# Patient Record
Sex: Female | Born: 1990 | Race: Black or African American | Hispanic: No | Marital: Single | Smoking: Former smoker
Health system: Southern US, Community
[De-identification: ages and names within clinical notes are randomized; demographics above are authoritative.]

## PROBLEM LIST (undated history)

## (undated) DIAGNOSIS — N760 Acute vaginitis: Secondary | ICD-10-CM

## (undated) DIAGNOSIS — N83209 Unspecified ovarian cyst, unspecified side: Secondary | ICD-10-CM

## (undated) DIAGNOSIS — K3580 Unspecified acute appendicitis: Secondary | ICD-10-CM

## (undated) DIAGNOSIS — F419 Anxiety disorder, unspecified: Secondary | ICD-10-CM

## (undated) DIAGNOSIS — B9689 Other specified bacterial agents as the cause of diseases classified elsewhere: Secondary | ICD-10-CM

## (undated) DIAGNOSIS — Z8619 Personal history of other infectious and parasitic diseases: Secondary | ICD-10-CM

## (undated) HISTORY — DX: Other specified bacterial agents as the cause of diseases classified elsewhere: B96.89

## (undated) HISTORY — DX: Personal history of other infectious and parasitic diseases: Z86.19

## (undated) HISTORY — DX: Anxiety disorder, unspecified: F41.9

## (undated) HISTORY — DX: Other specified bacterial agents as the cause of diseases classified elsewhere: N76.0

## (undated) HISTORY — PX: APPENDECTOMY: SHX54

## (undated) HISTORY — DX: Unspecified ovarian cyst, unspecified side: N83.209

---

## 2012-06-27 ENCOUNTER — Ambulatory Visit: Payer: Self-pay | Admitting: Emergency Medicine

## 2012-06-27 VITALS — BP 123/86 | HR 72 | Temp 98.0°F | Resp 16 | Ht 61.0 in | Wt 125.0 lb

## 2012-06-27 DIAGNOSIS — J018 Other acute sinusitis: Secondary | ICD-10-CM

## 2012-06-27 DIAGNOSIS — J209 Acute bronchitis, unspecified: Secondary | ICD-10-CM

## 2012-06-27 MED ORDER — PROMETHAZINE-CODEINE 6.25-10 MG/5ML PO SYRP: 5.0000 mL | ORAL_SOLUTION | ORAL | Status: DC | PRN

## 2012-06-27 MED ORDER — PSEUDOEPHEDRINE-GUAIFENESIN ER 60-600 MG PO TB12: 1.0000 | ORAL_TABLET | Freq: Two times a day (BID) | ORAL | Status: AC

## 2012-06-27 MED ORDER — AMOXICILLIN-POT CLAVULANATE 875-125 MG PO TABS: 1.0000 | ORAL_TABLET | Freq: Two times a day (BID) | ORAL | Status: DC

## 2012-06-27 NOTE — Progress Notes (Signed)
[facility] [address] [place] [address] [phone]  Date:  06/27/2012   Name:  Alyssa Olson   DOB:  May 08, 1990   MRN:  [id number]  PCP:  No PCP Per Patient    Chief Complaint: URI, Cough and Constipation   History of Present Illness:  Alyssa Olson is a 22 y.o. very pleasant female patient who presents with the following:  Ill for a week with a "cold" that is not improving.  She has a purulent nasal drainage and post nasal drip.  Her cough is productive of scant muco-purulent sputum.  No wheezing or shortness of breath.  No current fever or chills.  Had fever earlier this week.  No nausea or vomiting. No stool change, rash.  No improvement with over the counter medications or other home remedies. Denies other complaint or health concern today.   There are no active problems to display for this patient.   History reviewed. No pertinent past medical history.  History reviewed. No pertinent past surgical history.  History  Substance Use Topics  . Smoking status: Never Smoker   . Smokeless tobacco: Not on file  . Alcohol Use: No    Family History  Problem Relation Age of Onset  . Cancer Maternal Grandfather   . Hypertension Maternal Grandfather     No Known Allergies  Medication list has been reviewed and updated.  No current outpatient prescriptions on file prior to visit.   No current facility-administered medications on file prior to visit.    Review of Systems:  As per HPI, otherwise negative.    Physical Examination: Filed Vitals:   06/27/12 1727  BP: 123/86  Pulse: 72  Temp: 98 F (36.7 C)  Resp: 16   Filed Vitals:   06/27/12 1727  Height: 5\' 1"  (1.549 m)  Weight: 125 lb (56.7 kg)   Body mass index is 23.63 kg/(m^2). Ideal Body Weight: Weight in (lb) to have BMI = 25: 132  GEN: WDWN, NAD, Non-toxic, A & O x 3 HEENT: Atraumatic, Normocephalic. Neck supple. No masses, No LAD. Ears and Nose: No external  deformity.  Purulent nasal drainage CV: RRR, No M/G/R. No JVD. No thrill. No extra heart sounds. PULM: CTA B, no wheezes, crackles, rhonchi. No retractions. No resp. distress. No accessory muscle use. ABD: S, NT, ND, +BS. No rebound. No HSM. EXTR: No c/c/e NEURO Normal gait.  PSYCH: Normally interactive. Conversant. Not depressed or anxious appearing.  Calm demeanor.    Assessment and Plan: Sinusitis augmentin mucinex d   Signed,  Phillips Odor, MD

## 2013-02-26 ENCOUNTER — Emergency Department: Payer: Self-pay | Admitting: Emergency Medicine

## 2013-02-26 LAB — CBC
HCT: 39.5 % (ref 35.0–47.0)
HGB: 13.4 g/dL (ref 12.0–16.0)
MCH: 29.6 pg (ref 26.0–34.0)
MCHC: 33.9 g/dL (ref 32.0–36.0)
MCV: 87 fL (ref 80–100)
Platelet: 293 10*3/uL (ref 150–440)
RBC: 4.53 10*6/uL (ref 3.80–5.20)
RDW: 13.5 % (ref 11.5–14.5)
WBC: 13.9 10*3/uL — ABNORMAL HIGH (ref 3.6–11.0)

## 2013-02-26 LAB — HCG, QUANTITATIVE, PREGNANCY: Beta Hcg, Quant.: 44039 m[IU]/mL — ABNORMAL HIGH

## 2013-03-28 LAB — HM PAP SMEAR: HM Pap smear: NEGATIVE

## 2013-10-22 ENCOUNTER — Inpatient Hospital Stay: Payer: Self-pay

## 2013-10-22 LAB — CBC WITH DIFFERENTIAL/PLATELET
Basophil #: 0 10*3/uL (ref 0.0–0.1)
Basophil %: 0.2 %
Eosinophil #: 0.1 10*3/uL (ref 0.0–0.7)
Eosinophil %: 0.4 %
HCT: 39.7 % (ref 35.0–47.0)
HGB: 12.6 g/dL (ref 12.0–16.0)
Lymphocyte #: 1.8 10*3/uL (ref 1.0–3.6)
Lymphocyte %: 15.5 %
MCH: 28.1 pg (ref 26.0–34.0)
MCHC: 31.8 g/dL — ABNORMAL LOW (ref 32.0–36.0)
MCV: 88 fL (ref 80–100)
Monocyte #: 1 x10 3/mm — ABNORMAL HIGH (ref 0.2–0.9)
Monocyte %: 8.2 %
Neutrophil #: 8.9 10*3/uL — ABNORMAL HIGH (ref 1.4–6.5)
Neutrophil %: 75.7 %
Platelet: 242 10*3/uL (ref 150–440)
RBC: 4.49 10*6/uL (ref 3.80–5.20)
RDW: 14.3 % (ref 11.5–14.5)
WBC: 11.8 10*3/uL — ABNORMAL HIGH (ref 3.6–11.0)

## 2013-10-23 LAB — GC/CHLAMYDIA PROBE AMP

## 2013-10-24 LAB — HEMATOCRIT: HCT: 33.8 % — ABNORMAL LOW (ref 35.0–47.0)

## 2014-05-26 NOTE — H&P (Signed)
L&D Evaluation:  History:  Presents with contractions   Patient's Medical History No Chronic Illness   Patient's Surgical History none   Medications Pre Natal Vitamins  Tylenol (Acetaminophen)  diclegis; triamcinilone   Allergies NKDA   Social History none   Family History Non-Contributory   ROS:  ROS All systems were reviewed.  HEENT, CNS, GI, GU, Respiratory, CV, Renal and Musculoskeletal systems were found to be normal.   Exam:  Vital Signs stable   General no apparent distress   Mental Status clear   Chest clear   Heart normal sinus rhythm   Abdomen gravid, tender with contractions   Estimated Fetal Weight Average for gestational age   Fetal Position vtx   Pelvic 4/80/-2   Mebranes Intact   FHT normal rate with no decels   Fetal Heart Rate 148   Ucx regular, pitocin at 529mu/min   Ucx Frequency 2 min   Length of each Contraction 60 seconds   Ucx Pain Scale 8   Skin dry   Impression:  Impression IOL at 40 weeks for oligohydramnios   Plan:  Plan EFM/NST, monitor contractions and for cervical change, antibiotics for GBBS prophylaxis, pitocin IOL per protocol   Electronic Signatures: Yolanda BonineBurr, Malaiyah Achorn N (CNM)  (Signed 07-Oct-15 21:12)  Authored: L&D Evaluation   Last Updated: 07-Oct-15 21:12 by Ulyses AmorBurr, Britnay Magnussen N (CNM)

## 2014-10-14 ENCOUNTER — Ambulatory Visit: Payer: Self-pay | Admitting: Obstetrics and Gynecology

## 2014-10-26 ENCOUNTER — Encounter: Payer: Self-pay | Admitting: *Deleted

## 2014-10-29 ENCOUNTER — Ambulatory Visit: Payer: Self-pay | Admitting: Obstetrics and Gynecology

## 2014-11-09 DIAGNOSIS — R194 Change in bowel habit: Secondary | ICD-10-CM | POA: Insufficient documentation

## 2014-11-09 DIAGNOSIS — E669 Obesity, unspecified: Secondary | ICD-10-CM | POA: Insufficient documentation

## 2014-11-09 DIAGNOSIS — M25562 Pain in left knee: Secondary | ICD-10-CM | POA: Insufficient documentation

## 2014-11-11 DIAGNOSIS — E8809 Other disorders of plasma-protein metabolism, not elsewhere classified: Secondary | ICD-10-CM | POA: Insufficient documentation

## 2014-11-11 DIAGNOSIS — R779 Abnormality of plasma protein, unspecified: Secondary | ICD-10-CM | POA: Insufficient documentation

## 2014-11-11 DIAGNOSIS — R700 Elevated erythrocyte sedimentation rate: Secondary | ICD-10-CM | POA: Insufficient documentation

## 2014-11-19 DIAGNOSIS — R768 Other specified abnormal immunological findings in serum: Secondary | ICD-10-CM | POA: Insufficient documentation

## 2014-11-25 ENCOUNTER — Ambulatory Visit: Payer: Self-pay | Admitting: Obstetrics and Gynecology

## 2015-01-01 DIAGNOSIS — G8929 Other chronic pain: Secondary | ICD-10-CM | POA: Insufficient documentation

## 2015-01-01 DIAGNOSIS — M545 Low back pain, unspecified: Secondary | ICD-10-CM | POA: Insufficient documentation

## 2015-03-19 ENCOUNTER — Encounter: Payer: Self-pay | Admitting: Obstetrics and Gynecology

## 2015-04-09 ENCOUNTER — Other Ambulatory Visit: Payer: Self-pay | Admitting: Obstetrics and Gynecology

## 2015-04-09 ENCOUNTER — Ambulatory Visit (INDEPENDENT_AMBULATORY_CARE_PROVIDER_SITE_OTHER): Payer: Managed Care, Other (non HMO) | Admitting: Obstetrics and Gynecology

## 2015-04-09 ENCOUNTER — Encounter: Payer: Self-pay | Admitting: Obstetrics and Gynecology

## 2015-04-09 VITALS — BP 118/86 | HR 86 | Ht 61.0 in | Wt 149.8 lb

## 2015-04-09 DIAGNOSIS — Z01419 Encounter for gynecological examination (general) (routine) without abnormal findings: Secondary | ICD-10-CM | POA: Diagnosis not present

## 2015-04-09 NOTE — Progress Notes (Signed)
  Subjective:     Alyssa Olson is a 25 y.o. female and is here for a comprehensive physical exam. The patient reports no problems. Stopped OCPs and is using condoms. Social History   Social History  . Marital Status: Single    Spouse Name: N/A  . Number of Children: N/A  . Years of Education: N/A   Occupational History  . Not on file.   Social History Main Topics  . Smoking status: Current Every Day Smoker  . Smokeless tobacco: Never Used  . Alcohol Use: Yes  . Drug Use: No  . Sexual Activity: Yes    Birth Control/ Protection: None   Other Topics Concern  . Not on file   Social History Narrative   Health Maintenance  Topic Date Due  . HIV Screening  12/03/2006  . TETANUS/TDAP  12/03/2010  . INFLUENZA VACCINE  08/17/2014  . PAP SMEAR  03/28/2016    The following portions of the patient's history were reviewed and updated as appropriate: allergies, current medications, past family history, past medical history, past social history, past surgical history and problem list.  Review of Systems A comprehensive review of systems was negative.   Objective:    General appearance: alert, cooperative and appears stated age Neck: no adenopathy, no carotid bruit, no JVD, supple, symmetrical, trachea midline and thyroid not enlarged, symmetric, no tenderness/mass/nodules Breasts: normal appearance, no masses or tenderness Heart: regular rate and rhythm, S1, S2 normal, no murmur, click, rub or gallop Abdomen: soft, non-tender; bowel sounds normal; no masses,  no organomegaly Pelvic: cervix normal in appearance, external genitalia normal, no adnexal masses or tenderness, no cervical motion tenderness, rectovaginal septum normal, uterus normal size, shape, and consistency and vagina normal without discharge    Assessment:    Healthy female exam. overweight      Plan:     See After Visit Summary for Counseling Recommendations

## 2015-04-09 NOTE — Patient Instructions (Signed)
  Place annual gynecologic exam patient instructions here.  Thank you for enrolling in MyChart. Please follow the instructions below to securely access your online medical record. MyChart allows you to send messages to your doctor, view your test results, manage appointments, and more.   How Do I Sign Up? 1. In your Internet browser, go to [employer] Address [employer] and enter [email or web address]. 2. Click on the Sign Up Now link in the Sign In box. You will see the New Member Sign Up page. 3. Enter your MyChart Access Code exactly as it appears below. You will not need to use this code after you've completed the sign-up process. If you do not sign up before the expiration date, you must request a new code.  MyChart Access Code: [email or web address] Expires: 05/18/2015 12:28 PM  4. Enter your Social Security Number ([id number]) and Date of Birth (mm/dd/yyyy) as indicated and click Submit. You will be taken to the next sign-up page. 5. Create a MyChart ID. This will be your MyChart login ID and cannot be changed, so think of one that is secure and easy to remember. 6. Create a MyChart password. You can change your password at any time. 7. Enter your Password Reset Question and Answer. This can be used at a later time if you forget your password.  8. Enter your e-mail address. You will receive e-mail notification when new information is available in MyChart. 9. Click Sign Up. You can now view your medical record.   Additional Information Remember, MyChart is NOT to be used for urgent needs. For medical emergencies, dial 911.

## 2015-04-12 LAB — CYTOLOGY - PAP

## 2015-04-13 ENCOUNTER — Telehealth: Payer: Self-pay | Admitting: *Deleted

## 2015-04-13 ENCOUNTER — Other Ambulatory Visit: Payer: Self-pay | Admitting: Obstetrics and Gynecology

## 2015-04-13 DIAGNOSIS — A749 Chlamydial infection, unspecified: Secondary | ICD-10-CM | POA: Insufficient documentation

## 2015-04-13 MED ORDER — AZITHROMYCIN 500 MG PO TABS: 1000.0000 mg | ORAL_TABLET | Freq: Once | ORAL | Status: DC

## 2015-04-13 NOTE — Telephone Encounter (Signed)
-----   Message from Purcell NailsMelody N Shambley, [place] sent at 04/13/2015  3:18 PM EDT ----- Please let pt know about +chlamydia on pap, I sent in prescription and I want to see her in 1 month for TOC

## 2015-04-13 NOTE — Telephone Encounter (Signed)
Notified pt of results she will call back for TOC

## 2015-05-19 ENCOUNTER — Encounter: Payer: Self-pay | Admitting: Obstetrics and Gynecology

## 2015-05-19 ENCOUNTER — Ambulatory Visit (INDEPENDENT_AMBULATORY_CARE_PROVIDER_SITE_OTHER): Payer: Managed Care, Other (non HMO) | Admitting: Obstetrics and Gynecology

## 2015-05-19 ENCOUNTER — Other Ambulatory Visit: Payer: Self-pay | Admitting: Obstetrics and Gynecology

## 2015-05-19 VITALS — BP 132/84 | HR 88 | Ht 61.0 in | Wt 145.5 lb

## 2015-05-19 DIAGNOSIS — Z202 Contact with and (suspected) exposure to infections with a predominantly sexual mode of transmission: Secondary | ICD-10-CM | POA: Diagnosis not present

## 2015-05-19 NOTE — Patient Instructions (Signed)
Thank you for enrolling in MyChart. Please follow the instructions below to securely access your online medical record. MyChart allows you to send messages to your doctor, view your test results, renew your prescriptions, schedule appointments, and more.  How Do I Sign Up? 1. In your Internet browser, go to [email or web address]. 2. Click on the New  User? link in the Sign In box.  3. Enter your MyChart Access Code exactly as it appears below. You will not need to use this code after you have completed the sign-up process. If you do not sign up before the expiration date, you must request a new code. MyChart Access Code: [email or web address] Expires: 07/18/2015  3:20 PM  4. Enter the last four digits of your Social Security Number (xxxx) and Date of Birth (mm/dd/yyyy) as indicated and click Next. You will be taken to the next sign-up page. 5. Create a MyChart ID. This will be your MyChart login ID and cannot be changed, so think of one that is secure and easy to remember. 6. Create a MyChart password. You can change your password at any time. 7. Enter your Password Reset Question and Answer and click Next. This can be used at a later time if you forget your password.  8. Select your communication preference, and if applicable enter your e-mail address. You will receive e-mail notification when new information is available in MyChart by choosing to receive e-mail notifications and filling in your e-mail. 9. Click Sign In. You can now view your medical record.   Additional Information If you have questions, you can email [email or web address] or call [phone] to talk to our MyChart staff. Remember, MyChart is NOT to be used for urgent needs. For medical emergencies, dial 911.

## 2015-05-19 NOTE — Progress Notes (Signed)
Subjective:     Patient ID: Alyssa Olson, female   DOB: [birth year], 25 y.o.   MRN: [id number]  HPI +CMZ one month ago- [facility] denies any symptoms and no sexual contact  Review of Systems negative    Objective:   Physical Exam A&O x4 Well groomed female in no distress Blood pressure 132/84, pulse 88, height 5\' 1"  (1.549 m), weight 145 lb 8 oz (65.998 kg), last menstrual period 05/09/2015, not currently breastfeeding. Pelvic exam: normal external genitalia, vulva, vagina, cervix, uterus and adnexa.    Assessment:     CMZ TOC     Plan:     Will let patient no results. Reminded of need for abstinance &/or condoms use for STI prevention.  Evelynn Hench [place], CNM

## 2015-05-21 DIAGNOSIS — E781 Pure hyperglyceridemia: Secondary | ICD-10-CM | POA: Insufficient documentation

## 2015-05-25 ENCOUNTER — Telehealth: Payer: Self-pay | Admitting: *Deleted

## 2015-05-25 ENCOUNTER — Other Ambulatory Visit: Payer: Self-pay | Admitting: Obstetrics and Gynecology

## 2015-05-25 MED ORDER — CLINDAMYCIN PHOSPHATE (1 DOSE) 2 % VA CREA: 1.0000 | TOPICAL_CREAM | Freq: Every day | VAGINAL | Status: DC

## 2015-05-25 NOTE — Telephone Encounter (Signed)
-----   Message from ExiraMelody N Shambley, [place] sent at 05/25/2015  4:24 PM EDT ----- Please let her know her vaginal swab showed BV resistent to traditional meds- so I sent in a prescription for vaginal cream- to use one applicatorful at bedtime for a week, no STD seen

## 2015-05-25 NOTE — Telephone Encounter (Signed)
Notified pt of results and medication sent to pharmacy

## 2015-05-27 ENCOUNTER — Encounter: Payer: Self-pay | Admitting: Obstetrics and Gynecology

## 2015-06-10 ENCOUNTER — Encounter: Payer: Self-pay | Admitting: Obstetrics and Gynecology

## 2015-10-14 ENCOUNTER — Telehealth: Payer: Managed Care, Other (non HMO) | Admitting: Physician Assistant

## 2015-10-14 DIAGNOSIS — B373 Candidiasis of vulva and vagina: Secondary | ICD-10-CM

## 2015-10-14 DIAGNOSIS — B3731 Acute candidiasis of vulva and vagina: Secondary | ICD-10-CM

## 2015-10-14 MED ORDER — FLUCONAZOLE 150 MG PO TABS: 150.0000 mg | ORAL_TABLET | Freq: Once | ORAL | 0 refills | Status: AC

## 2015-10-14 NOTE — Progress Notes (Signed)

## 2015-11-12 ENCOUNTER — Ambulatory Visit
Admission: EM | Admit: 2015-11-12 | Discharge: 2015-11-12 | Disposition: A | Discharge status: Home / Self Care | Payer: Managed Care, Other (non HMO) | Attending: Family Medicine | Admitting: Family Medicine

## 2015-11-12 DIAGNOSIS — N3000 Acute cystitis without hematuria: Secondary | ICD-10-CM | POA: Diagnosis not present

## 2015-11-12 LAB — URINALYSIS COMPLETE WITH MICROSCOPIC (ARMC ONLY)
Bacteria, UA: NONE SEEN
Bilirubin Urine: NEGATIVE
Glucose, UA: NEGATIVE mg/dL
Hgb urine dipstick: NEGATIVE
Ketones, ur: NEGATIVE mg/dL
Nitrite: NEGATIVE
Protein, ur: NEGATIVE mg/dL
RBC / HPF: NONE SEEN RBC/hpf (ref 0–5)
Specific Gravity, Urine: 1.02 (ref 1.005–1.030)
pH: 7 (ref 5.0–8.0)

## 2015-11-12 MED ORDER — CIPROFLOXACIN HCL 500 MG PO TABS: 500.0000 mg | ORAL_TABLET | Freq: Two times a day (BID) | ORAL | 0 refills | Status: DC

## 2015-11-12 MED ORDER — PHENAZOPYRIDINE HCL 200 MG PO TABS: 200.0000 mg | ORAL_TABLET | Freq: Three times a day (TID) | ORAL | 0 refills | Status: DC | PRN

## 2015-11-12 NOTE — ED Provider Notes (Signed)
[facility]    CSN: [id number] Arrival date & time: 11/12/15  1030     History   Chief Complaint Chief Complaint  Patient presents with  . Urinary Tract Infection    HPI Alyssa Olson is a 25 y.o. female.   Patient is a 25 year old black female who is here because of burning on urination frequency. She denies any type of vaginal discharge started about 5-7 days ago. She states she also is no longer with her baby's father and also was rechecked for STDs as well. No known drug allergies. She does not smoke. No chronic medical problems and no pertinent family medical history pertaining to today's visit.   The history is provided by the patient. No language interpreter was used.  Urinary Tract Infection  Pain quality:  Burning and aching Pain severity:  Moderate Progression:  Worsening Chronicity:  New Relieved by:  Nothing Associated symptoms: no vaginal discharge   Risk factors: sexually active   Risk factors: no recurrent urinary tract infections   Risk factors comment:  Hx of bacterial vaginitis   History reviewed. No pertinent past medical history.  There are no active problems to display for this patient.   History reviewed. No pertinent surgical history.  OB History    No data available       Home Medications    Prior to Admission medications   Medication Sig Start Date End Date Taking? Authorizing Provider  ciprofloxacin (CIPRO) 500 MG tablet Take 1 tablet (500 mg total) by mouth 2 (two) times daily. 11/12/15   Hassan RowanEugene Shaleah Nissley, MD  phenazopyridine (PYRIDIUM) 200 MG tablet Take 1 tablet (200 mg total) by mouth 3 (three) times daily as needed for pain. 11/12/15   Hassan RowanEugene Laquasha Groome, MD    Family History Family History  Problem Relation Age of Onset  . Cancer Maternal Grandfather   . Hypertension Maternal Grandfather     Social History Social History  Substance Use Topics  . Smoking status: Never Smoker  . Smokeless tobacco: Never Used  .  Alcohol use No     Allergies   Review of patient's allergies indicates no known allergies.   Review of Systems Review of Systems  Genitourinary: Positive for dysuria, frequency and urgency. Negative for vaginal discharge.  All other systems reviewed and are negative.    Physical Exam Triage Vital Signs ED Triage Vitals  Enc Vitals Group     BP 11/12/15 1051 119/86     Pulse Rate 11/12/15 1051 83     Resp 11/12/15 1051 18     Temp 11/12/15 1051 97.8 F (36.6 C)     Temp Source 11/12/15 1051 Oral     SpO2 11/12/15 1051 99 %     Weight 11/12/15 1050 147 lb (66.7 kg)     Height 11/12/15 1050 5\' 1"  (1.549 m)     Head Circumference --      Peak Flow --      Pain Score 11/12/15 1051 0     Pain Loc --      Pain Edu? --      Excl. in GC? --    No data found.   Updated Vital Signs BP 119/86 (BP Location: Left Arm)   Pulse 83   Temp 97.8 F (36.6 C) (Oral)   Resp 18   Ht 5\' 1"  (1.549 m)   Wt 147 lb (66.7 kg)   LMP 11/03/2015   SpO2 99%   BMI 27.78 kg/m   Visual  Acuity Right Eye Distance:   Left Eye Distance:   Bilateral Distance:    Right Eye Near:   Left Eye Near:    Bilateral Near:     Physical Exam  Constitutional: She is oriented to person, place, and time. She appears well-developed and well-nourished.  HENT:  Head: Normocephalic and atraumatic.  Eyes: Pupils are equal, round, and reactive to light.  Neck: Normal range of motion.  Pulmonary/Chest: Breath sounds normal.  Abdominal: Soft. Bowel sounds are normal. She exhibits no distension. There is no tenderness.  Musculoskeletal: Normal range of motion. She exhibits no deformity.  Neurological: She is alert and oriented to person, place, and time.  Skin: Skin is warm.  Psychiatric: She has a normal mood and affect.  Vitals reviewed.    UC Treatments / Results  Labs (all labs ordered are listed, but only abnormal results are displayed) Labs Reviewed  URINALYSIS COMPLETEWITH MICROSCOPIC ([facility]  ONLY) - Abnormal; Notable for the following:       Result Value   Leukocytes, UA TRACE (*)    Squamous Epithelial / LPF 0-5 (*)    All other components within normal limits  URINE CULTURE    EKG  EKG Interpretation None       Radiology No results found.  Procedures Procedures (including critical care time)  Medications Ordered in UC Medications - No data to display   Initial Impression / Assessment and Plan / UC Course  I have reviewed the triage vital signs and the nursing notes.  Pertinent labs & imaging results that were available during my care of the patient were reviewed by me and considered in my medical decision making (see chart for details).  Clinical Course    Patient was informed that her urine was abnormal and that it does appear that she may have a UTI. We will culture urine. Patient also worried about STDs apparently she is longer the relationship that she was in before with the baby's father. The problem is that she came with the baby who is very active and explained to the mother and be very difficult to do a pelvic exam with this young man in the room and I doubt that he would be sitting quite enough even on her side or in the reception area without her. Recommend that she retry to return and I am here Saturday and Sunday and will be glad to see her if she can have someone either come with her to wash her son or have someone babysit her son at home   We'll start her on Cipro 500 mg 1 tablet twice a day for 5 days and Pyridium 200 mg 1 tablet 3 times a day for 5 days when necessary. Work note given for today as well.  Final Clinical Impressions(s) / UC Diagnoses   Final diagnoses:  Acute cystitis without hematuria    New Prescriptions New Prescriptions   CIPROFLOXACIN (CIPRO) 500 MG TABLET    Take 1 tablet (500 mg total) by mouth 2 (two) times daily.   PHENAZOPYRIDINE (PYRIDIUM) 200 MG TABLET    Take 1 tablet (200 mg total) by mouth 3 (three) times  daily as needed for pain.     Note: This dictation was prepared with Dragon dictation along with smaller phrase technology. Any transcriptional errors that result from this process are unintentional.   Hassan Rowan, MD 11/12/15 1209

## 2015-11-12 NOTE — ED Triage Notes (Signed)
Pt says she is having frequency with voiding, and odor. She is unsure of an exposure to an STD but she just wants to be checked because she does have unprotected sex. Denies any vaginal discharge.

## 2015-11-14 LAB — URINE CULTURE: Culture: NO GROWTH

## 2015-11-15 ENCOUNTER — Telehealth: Payer: Self-pay

## 2015-11-15 NOTE — Telephone Encounter (Signed)
Courtesy call back completed today for patients recent visit at [facility]. Patient did not answer, left message on voicemail to call back with any questions or concerns.   

## 2015-11-15 NOTE — Telephone Encounter (Signed)
-----   Message from Hassan RowanEugene Wade, MD sent at 11/14/2015  9:02 AM EDT ----- Please notify patient that her urine culture was negative. She may finish the antibiotic for the possible UTI and she still wants to be evaluated for STD exposure please return

## 2016-02-11 ENCOUNTER — Ambulatory Visit
Admission: EM | Admit: 2016-02-11 | Discharge: 2016-02-11 | Disposition: A | Discharge status: Home / Self Care | Payer: 59 | Attending: Family Medicine | Admitting: Family Medicine

## 2016-02-11 ENCOUNTER — Encounter: Payer: Self-pay | Admitting: Emergency Medicine

## 2016-02-11 DIAGNOSIS — J0100 Acute maxillary sinusitis, unspecified: Secondary | ICD-10-CM

## 2016-02-11 LAB — RAPID STREP SCREEN (MED CTR MEBANE ONLY): Streptococcus, Group A Screen (Direct): NEGATIVE

## 2016-02-11 MED ORDER — AMOXICILLIN-POT CLAVULANATE 875-125 MG PO TABS: 1.0000 | ORAL_TABLET | Freq: Two times a day (BID) | ORAL | 0 refills | Status: DC

## 2016-02-11 NOTE — ED Triage Notes (Signed)
Patient c/o sore throat, cough, low grade fever, and congestion for a week.

## 2016-02-11 NOTE — ED Provider Notes (Signed)
[facility]    CSN: [id number] Arrival date & time: 02/11/16  1945     History   Chief Complaint Chief Complaint  Patient presents with  . Sore Throat  . Cough    HPI Alyssa Olson is a 26 y.o. female.   The history is provided by the patient.  Sore Throat  Associated symptoms include headaches.  Cough  Associated symptoms: fever, headaches and sore throat   Associated symptoms: no wheezing   URI  Presenting symptoms: congestion, cough, fatigue, fever and sore throat   Severity:  Moderate Onset quality:  Sudden Duration:  10 days Timing:  Constant Progression:  Worsening Chronicity:  New Relieved by:  Nothing Ineffective treatments:  OTC medications Associated symptoms: headaches and sinus pain   Associated symptoms: no wheezing   Risk factors: sick contacts   Risk factors: not elderly, no chronic cardiac disease, no chronic kidney disease, no chronic respiratory disease, no diabetes mellitus, no immunosuppression, no recent illness and no recent travel     Past Medical History:  Diagnosis Date  . Bacterial vaginosis   . Bacterial vaginosis   . History of chlamydia     Patient Active Problem List   Diagnosis Date Noted  . Chlamydia 04/13/2015    History reviewed. No pertinent surgical history.  OB History    Gravida Para Term Preterm AB Living   2 0 0 0 1 1   SAB TAB Ectopic Multiple Live Births   0 1 0   1       Home Medications    Prior to Admission medications   Medication Sig Start Date End Date Taking? Authorizing Provider  predniSONE (DELTASONE) 5 MG tablet Take 5 mg by mouth 2 (two) times daily with a meal.   Yes Historical Provider, MD  amoxicillin-clavulanate (AUGMENTIN) 875-125 MG tablet Take 1 tablet by mouth 2 (two) times daily. 02/11/16   Payton Mccallumrlando Corrinna Karapetyan, MD    Family History Family History  Problem Relation Age of Onset  . Cancer Maternal Grandfather     colon  . Hypertension Maternal Grandfather     Social  History Social History  Substance Use Topics  . Smoking status: Never Smoker  . Smokeless tobacco: Never Used  . Alcohol use No     Allergies   Patient has no known allergies.   Review of Systems Review of Systems  Constitutional: Positive for fatigue and fever.  HENT: Positive for congestion, sinus pain and sore throat.   Respiratory: Positive for cough. Negative for wheezing.   Neurological: Positive for headaches.     Physical Exam Triage Vital Signs ED Triage Vitals  Enc Vitals Group     BP 02/11/16 1956 119/89     Pulse Rate 02/11/16 1956 (!) 103     Resp 02/11/16 1956 16     Temp 02/11/16 1956 98.5 F (36.9 C)     Temp Source 02/11/16 1956 Oral     SpO2 02/11/16 1956 99 %     Weight 02/11/16 1954 148 lb (67.1 kg)     Height 02/11/16 1954 5\' 1"  (1.549 m)     Head Circumference --      Peak Flow --      Pain Score 02/11/16 1956 6     Pain Loc --      Pain Edu? --      Excl. in GC? --    No data found.   Updated Vital Signs BP 119/89 (BP Location: Left  Arm)   Pulse (!) 103   Temp 98.5 F (36.9 C) (Oral)   Resp 16   Ht 5\' 1"  (1.549 m)   Wt 148 lb (67.1 kg)   LMP 01/20/2016 (Approximate)   SpO2 99%   BMI 27.96 kg/m   Visual Acuity Right Eye Distance:   Left Eye Distance:   Bilateral Distance:    Right Eye Near:   Left Eye Near:    Bilateral Near:     Physical Exam  Constitutional: She appears well-developed and well-nourished. No distress.  HENT:  Head: Normocephalic and atraumatic.  Right Ear: Tympanic membrane, external ear and ear canal normal.  Left Ear: Tympanic membrane, external ear and ear canal normal.  Nose: Mucosal edema and rhinorrhea present. No nose lacerations, sinus tenderness, nasal deformity, septal deviation or nasal septal hematoma. No epistaxis.  No foreign bodies. Right sinus exhibits maxillary sinus tenderness and frontal sinus tenderness. Left sinus exhibits maxillary sinus tenderness and frontal sinus tenderness.    Mouth/Throat: Uvula is midline, oropharynx is clear and moist and mucous membranes are normal. No oropharyngeal exudate.  Eyes: Conjunctivae and EOM are normal. Pupils are equal, round, and reactive to light. Right eye exhibits no discharge. Left eye exhibits no discharge. No scleral icterus.  Neck: Normal range of motion. Neck supple. No thyromegaly present.  Cardiovascular: Normal rate, regular rhythm and normal heart sounds.   Pulmonary/Chest: Effort normal and breath sounds normal. No respiratory distress. She has no wheezes. She has no rales.  Lymphadenopathy:    She has no cervical adenopathy.  Skin: She is not diaphoretic.  Nursing note and vitals reviewed.    UC Treatments / Results  Labs (all labs ordered are listed, but only abnormal results are displayed) Labs Reviewed  RAPID STREP SCREEN (NOT AT [facility])  CULTURE, GROUP A STREP [facility])    EKG  EKG Interpretation None       Radiology No results found.  Procedures Procedures (including critical care time)  Medications Ordered in UC Medications - No data to display   Initial Impression / Assessment and Plan / UC Course  I have reviewed the triage vital signs and the nursing notes.  Pertinent labs & imaging results that were available during my care of the patient were reviewed by me and considered in my medical decision making (see chart for details).       Final Clinical Impressions(s) / UC Diagnoses   Final diagnoses:  Acute maxillary sinusitis, recurrence not specified    New Prescriptions New Prescriptions   AMOXICILLIN-CLAVULANATE (AUGMENTIN) 875-125 MG TABLET    Take 1 tablet by mouth 2 (two) times daily.   1. diagnosis reviewed with patient 2. rx as per orders above; reviewed possible side effects, interactions, risks and benefits  3. Recommend supportive treatment with otc analgesics prn, fluids 4. Follow-up prn if symptoms worsen or don't improve   Payton Mccallum, MD 02/11/16 2031

## 2016-02-13 LAB — CULTURE, GROUP A STREP (THRC)

## 2016-02-15 ENCOUNTER — Telehealth: Payer: Self-pay

## 2016-02-15 NOTE — Telephone Encounter (Signed)
-----   Message from Payton Mccallumrlando Conty, MD sent at 02/15/2016  9:42 AM EST ----- Notify patient of culture result; patient on augmentin; continue and finish antibiotic

## 2016-02-15 NOTE — Telephone Encounter (Signed)
Left message for patient to return my call about test results.

## 2016-04-24 ENCOUNTER — Encounter: Payer: Self-pay | Admitting: Certified Nurse Midwife

## 2016-05-19 ENCOUNTER — Encounter: Payer: Self-pay | Admitting: Obstetrics and Gynecology

## 2016-08-05 ENCOUNTER — Encounter: Payer: Self-pay | Admitting: Medical Oncology

## 2016-08-05 ENCOUNTER — Emergency Department: Payer: 59 | Admitting: Anesthesiology

## 2016-08-05 ENCOUNTER — Encounter
Admission: EM | Disposition: A | Discharge status: Home / Self Care | Payer: Self-pay | Source: Home / Self Care | Attending: Emergency Medicine

## 2016-08-05 ENCOUNTER — Observation Stay
Admission: EM | Admit: 2016-08-05 | Discharge: 2016-08-06 | Disposition: A | Discharge status: Home / Self Care | Payer: 59 | Attending: Surgery | Admitting: Surgery

## 2016-08-05 ENCOUNTER — Emergency Department: Payer: 59

## 2016-08-05 DIAGNOSIS — R1031 Right lower quadrant pain: Secondary | ICD-10-CM | POA: Diagnosis present

## 2016-08-05 DIAGNOSIS — K353 Acute appendicitis with localized peritonitis, without perforation or gangrene: Secondary | ICD-10-CM

## 2016-08-05 DIAGNOSIS — K3580 Unspecified acute appendicitis: Secondary | ICD-10-CM | POA: Diagnosis present

## 2016-08-05 DIAGNOSIS — E669 Obesity, unspecified: Secondary | ICD-10-CM | POA: Insufficient documentation

## 2016-08-05 DIAGNOSIS — Z6830 Body mass index (BMI) 30.0-30.9, adult: Secondary | ICD-10-CM | POA: Diagnosis not present

## 2016-08-05 HISTORY — PX: LAPAROSCOPIC APPENDECTOMY: SHX408

## 2016-08-05 HISTORY — DX: Unspecified acute appendicitis: K35.80

## 2016-08-05 LAB — WET PREP, GENITAL
Clue Cells Wet Prep HPF POC: NONE SEEN
Sperm: NONE SEEN
Trich, Wet Prep: NONE SEEN
Yeast Wet Prep HPF POC: NONE SEEN

## 2016-08-05 LAB — URINALYSIS, COMPLETE (UACMP) WITH MICROSCOPIC
Bacteria, UA: NONE SEEN
Bilirubin Urine: NEGATIVE
Glucose, UA: NEGATIVE mg/dL
Ketones, ur: NEGATIVE mg/dL
Leukocytes, UA: NEGATIVE
Nitrite: NEGATIVE
Protein, ur: NEGATIVE mg/dL
Specific Gravity, Urine: 1.027 (ref 1.005–1.030)
pH: 7 (ref 5.0–8.0)

## 2016-08-05 LAB — LIPASE, BLOOD: Lipase: 31 U/L (ref 11–51)

## 2016-08-05 LAB — COMPREHENSIVE METABOLIC PANEL
ALT: 15 U/L (ref 14–54)
AST: 20 U/L (ref 15–41)
Albumin: 4.2 g/dL (ref 3.5–5.0)
Alkaline Phosphatase: 53 U/L (ref 38–126)
Anion gap: 6 (ref 5–15)
BUN: 11 mg/dL (ref 6–20)
CO2: 23 mmol/L (ref 22–32)
Calcium: 8.9 mg/dL (ref 8.9–10.3)
Chloride: 107 mmol/L (ref 101–111)
Creatinine, Ser: 0.71 mg/dL (ref 0.44–1.00)
GFR calc Af Amer: >60 mL/min (ref >60)
GFR calc non Af Amer: >60 mL/min (ref >60)
Glucose, Bld: 96 mg/dL (ref 65–99)
Potassium: 4 mmol/L (ref 3.5–5.1)
Sodium: 136 mmol/L (ref 135–145)
Total Bilirubin: 0.9 mg/dL (ref 0.3–1.2)
Total Protein: 7.5 g/dL (ref 6.5–8.1)

## 2016-08-05 LAB — CBC
HCT: 39.3 % (ref 35.0–47.0)
Hemoglobin: 13.6 g/dL (ref 12.0–16.0)
MCH: 29.3 pg (ref 26.0–34.0)
MCHC: 34.6 g/dL (ref 32.0–36.0)
MCV: 84.8 fL (ref 80.0–100.0)
Platelets: 292 10*3/uL (ref 150–440)
RBC: 4.63 MIL/uL (ref 3.80–5.20)
RDW: 13.3 % (ref 11.5–14.5)
WBC: 14.9 10*3/uL — ABNORMAL HIGH (ref 3.6–11.0)

## 2016-08-05 LAB — CHLAMYDIA/NGC RT PCR (ARMC ONLY)
Chlamydia Tr: NOT DETECTED
N gonorrhoeae: NOT DETECTED

## 2016-08-05 LAB — POCT PREGNANCY, URINE: Preg Test, Ur: NEGATIVE

## 2016-08-05 SURGERY — APPENDECTOMY, LAPAROSCOPIC
Anesthesia: General | Site: Abdomen | Wound class: Clean Contaminated

## 2016-08-05 MED ORDER — MORPHINE SULFATE (PF) 4 MG/ML IV SOLN
INTRAVENOUS | Status: AC
  Filled 2016-08-05: qty 1

## 2016-08-05 MED ORDER — ONDANSETRON HCL 4 MG/2ML IJ SOLN
4.0000 mg | Freq: Once | INTRAMUSCULAR | Status: AC
  Administered 2016-08-05: 4 mg via INTRAVENOUS
  Filled 2016-08-05: qty 2

## 2016-08-05 MED ORDER — MIDAZOLAM HCL 2 MG/2ML IJ SOLN
INTRAMUSCULAR | Status: DC | PRN
Start: 2016-08-05 — End: 2016-08-05
  Administered 2016-08-05: 2 mg via INTRAVENOUS

## 2016-08-05 MED ORDER — IOPAMIDOL (ISOVUE-300) INJECTION 61%
100.0000 mL | Freq: Once | INTRAVENOUS | Status: AC | PRN
  Administered 2016-08-05: 100 mL via INTRAVENOUS

## 2016-08-05 MED ORDER — METRONIDAZOLE IN NACL 5-0.79 MG/ML-% IV SOLN
500.0000 mg | Freq: Three times a day (TID) | INTRAVENOUS | Status: DC
  Administered 2016-08-05: 500 mg via INTRAVENOUS
  Filled 2016-08-05: qty 100

## 2016-08-05 MED ORDER — LACTATED RINGERS IV SOLN
INTRAVENOUS | Status: DC | PRN
  Administered 2016-08-05: 21:00:00 via INTRAVENOUS

## 2016-08-05 MED ORDER — ACETAMINOPHEN 10 MG/ML IV SOLN
INTRAVENOUS | Status: AC
  Filled 2016-08-05: qty 100

## 2016-08-05 MED ORDER — LACTATED RINGERS IV SOLN
125.0000 mL/h | INTRAVENOUS | Status: DC
  Administered 2016-08-06: 125 mL/h via INTRAVENOUS

## 2016-08-05 MED ORDER — MIDAZOLAM HCL 2 MG/2ML IJ SOLN
INTRAMUSCULAR | Status: AC
  Filled 2016-08-05: qty 2

## 2016-08-05 MED ORDER — BUPIVACAINE HCL 0.5 % IJ SOLN
INTRAMUSCULAR | Status: DC | PRN
  Administered 2016-08-05: 30 mL

## 2016-08-05 MED ORDER — ONDANSETRON HCL 4 MG/2ML IJ SOLN: 4.0000 mg | Freq: Four times a day (QID) | INTRAMUSCULAR | Status: DC | PRN

## 2016-08-05 MED ORDER — KETOROLAC TROMETHAMINE 30 MG/ML IJ SOLN
INTRAMUSCULAR | Status: DC | PRN
  Administered 2016-08-05: 30 mg via INTRAVENOUS

## 2016-08-05 MED ORDER — ACETAMINOPHEN 500 MG PO TABS: 1000.0000 mg | ORAL_TABLET | Freq: Four times a day (QID) | ORAL | Status: DC | PRN

## 2016-08-05 MED ORDER — PROPOFOL 10 MG/ML IV BOLUS
INTRAVENOUS | Status: DC | PRN
  Administered 2016-08-05: 2170 mg via INTRAVENOUS

## 2016-08-05 MED ORDER — PANTOPRAZOLE SODIUM 40 MG IV SOLR
40.0000 mg | Freq: Every day | INTRAVENOUS | Status: DC
  Filled 2016-08-05: qty 40

## 2016-08-05 MED ORDER — OXYCODONE HCL 5 MG PO TABS: 5.0000 mg | ORAL_TABLET | ORAL | Status: DC | PRN

## 2016-08-05 MED ORDER — FENTANYL CITRATE (PF) 100 MCG/2ML IJ SOLN: 25.0000 ug | INTRAMUSCULAR | Status: DC | PRN

## 2016-08-05 MED ORDER — HYDROMORPHONE HCL 1 MG/ML IJ SOLN
0.5000 mg | INTRAMUSCULAR | Status: DC | PRN
  Administered 2016-08-06: 0.5 mg via INTRAVENOUS
  Filled 2016-08-05: qty 0.5

## 2016-08-05 MED ORDER — SUCCINYLCHOLINE CHLORIDE 20 MG/ML IJ SOLN
INTRAMUSCULAR | Status: DC | PRN
Start: 2016-08-05 — End: 2016-08-05
  Administered 2016-08-05: 100 mg via INTRAVENOUS

## 2016-08-05 MED ORDER — SODIUM CHLORIDE 0.9 % IV BOLUS (SEPSIS)
1000.0000 mL | Freq: Once | INTRAVENOUS | Status: AC
  Administered 2016-08-05: 1000 mL via INTRAVENOUS

## 2016-08-05 MED ORDER — PROMETHAZINE HCL 25 MG/ML IJ SOLN: 6.2500 mg | INTRAMUSCULAR | Status: DC | PRN

## 2016-08-05 MED ORDER — MORPHINE SULFATE (PF) 4 MG/ML IV SOLN
4.0000 mg | Freq: Once | INTRAVENOUS | Status: AC
  Administered 2016-08-05: 4 mg via INTRAVENOUS

## 2016-08-05 MED ORDER — ROCURONIUM BROMIDE 100 MG/10ML IV SOLN
INTRAVENOUS | Status: DC | PRN
  Administered 2016-08-05: 35 mg via INTRAVENOUS
  Administered 2016-08-05: 5 mg via INTRAVENOUS

## 2016-08-05 MED ORDER — LIDOCAINE HCL (PF) 2 % IJ SOLN
INTRAMUSCULAR | Status: AC
  Filled 2016-08-05: qty 2

## 2016-08-05 MED ORDER — LIDOCAINE HCL (CARDIAC) 20 MG/ML IV SOLN
INTRAVENOUS | Status: DC | PRN
  Administered 2016-08-05: 100 mg via INTRAVENOUS

## 2016-08-05 MED ORDER — MORPHINE SULFATE (PF) 4 MG/ML IV SOLN
4.0000 mg | Freq: Once | INTRAVENOUS | Status: AC
  Administered 2016-08-05: 4 mg via INTRAVENOUS
  Filled 2016-08-05: qty 1

## 2016-08-05 MED ORDER — ACETAMINOPHEN 10 MG/ML IV SOLN
INTRAVENOUS | Status: DC | PRN
  Administered 2016-08-05: 1000 mg via INTRAVENOUS

## 2016-08-05 MED ORDER — KETOROLAC TROMETHAMINE 30 MG/ML IJ SOLN
30.0000 mg | Freq: Four times a day (QID) | INTRAMUSCULAR | Status: DC
  Administered 2016-08-06 (×2): 30 mg via INTRAVENOUS
  Filled 2016-08-05 (×2): qty 1

## 2016-08-05 MED ORDER — FENTANYL CITRATE (PF) 100 MCG/2ML IJ SOLN
INTRAMUSCULAR | Status: AC
  Filled 2016-08-05: qty 2

## 2016-08-05 MED ORDER — ENOXAPARIN SODIUM 40 MG/0.4ML ~~LOC~~ SOLN: 40.0000 mg | SUBCUTANEOUS | Status: DC

## 2016-08-05 MED ORDER — FENTANYL CITRATE (PF) 100 MCG/2ML IJ SOLN
INTRAMUSCULAR | Status: DC | PRN
  Administered 2016-08-05 (×4): 50 ug via INTRAVENOUS

## 2016-08-05 MED ORDER — IOPAMIDOL (ISOVUE-300) INJECTION 61%
30.0000 mL | Freq: Once | INTRAVENOUS | Status: AC | PRN
  Administered 2016-08-05: 30 mL via ORAL

## 2016-08-05 MED ORDER — ONDANSETRON 4 MG PO TBDP: 4.0000 mg | ORAL_TABLET | Freq: Four times a day (QID) | ORAL | Status: DC | PRN

## 2016-08-05 MED ORDER — ONDANSETRON HCL 4 MG/2ML IJ SOLN
INTRAMUSCULAR | Status: DC | PRN
  Administered 2016-08-05: 4 mg via INTRAVENOUS

## 2016-08-05 MED ORDER — DEXTROSE 5 % IV SOLN
2.0000 g | INTRAVENOUS | Status: AC
  Administered 2016-08-05: 2 g via INTRAVENOUS
  Filled 2016-08-05: qty 2

## 2016-08-05 MED ORDER — SUGAMMADEX SODIUM 200 MG/2ML IV SOLN
INTRAVENOUS | Status: DC | PRN
  Administered 2016-08-05: 145.2 mg via INTRAVENOUS

## 2016-08-05 MED ORDER — DEXAMETHASONE SODIUM PHOSPHATE 10 MG/ML IJ SOLN
INTRAMUSCULAR | Status: DC | PRN
  Administered 2016-08-05: 10 mg via INTRAVENOUS

## 2016-08-05 SURGICAL SUPPLY — 37 items
CANISTER SUCT 3000ML PPV (MISCELLANEOUS) ×3 IMPLANT
CHLORAPREP W/TINT 26ML (MISCELLANEOUS) ×3 IMPLANT
CUTTER FLEX LINEAR 45M (STAPLE) ×3 IMPLANT
DECANTER SPIKE VIAL GLASS SM (MISCELLANEOUS) IMPLANT
DEFOGGER SCOPE WARMER CLEARIFY (MISCELLANEOUS) ×3 IMPLANT
DERMABOND ADVANCED (GAUZE/BANDAGES/DRESSINGS) ×2
DERMABOND ADVANCED .7 DNX12 (GAUZE/BANDAGES/DRESSINGS) ×1 IMPLANT
DEVICE TROCAR PUNCTURE CLOSURE (ENDOMECHANICALS) ×3 IMPLANT
ELECT REM PT RETURN 9FT ADLT (ELECTROSURGICAL) ×3
ELECTRODE REM PT RTRN 9FT ADLT (ELECTROSURGICAL) ×1 IMPLANT
FILTER LAP SMOKE EVAC STRL (MISCELLANEOUS) ×3 IMPLANT
GLOVE BIO SURGEON STRL SZ7 (GLOVE) ×3 IMPLANT
GLOVE BIOGEL PI IND STRL 7.5 (GLOVE) ×1 IMPLANT
GLOVE BIOGEL PI INDICATOR 7.5 (GLOVE) ×2
GOWN STRL REUS W/ TWL XL LVL3 (GOWN DISPOSABLE) ×1 IMPLANT
GOWN STRL REUS W/TWL LRG LVL3 (GOWN DISPOSABLE) ×3 IMPLANT
GOWN STRL REUS W/TWL XL LVL3 (GOWN DISPOSABLE) ×2
IRRIGATION STRYKERFLOW (MISCELLANEOUS) ×1 IMPLANT
IRRIGATOR STRYKERFLOW (MISCELLANEOUS) ×3
KIT RM TURNOVER STRD PROC AR (KITS) ×3 IMPLANT
NEEDLE INSUFFLATION 14GA 120MM (NEEDLE) ×3 IMPLANT
NS IRRIG 1000ML POUR BTL (IV SOLUTION) ×3 IMPLANT
PACK LAP CHOLECYSTECTOMY (MISCELLANEOUS) ×3 IMPLANT
POUCH SPECIMEN RETRIEVAL 10MM (ENDOMECHANICALS) ×3 IMPLANT
RELOAD 45 VASCULAR/THIN (ENDOMECHANICALS) IMPLANT
RELOAD STAPLE TA45 3.5 REG BLU (ENDOMECHANICALS) IMPLANT
SHEARS HARMONIC ACE PLUS 36CM (ENDOMECHANICALS) ×3 IMPLANT
SLEEVE ENDOPATH XCEL 5M (ENDOMECHANICALS) ×3 IMPLANT
SOL .9 NS 3000ML IRR  AL (IV SOLUTION) ×2
SOL .9 NS 3000ML IRR UROMATIC (IV SOLUTION) ×1 IMPLANT
SUT MNCRL AB 4-0 PS2 18 (SUTURE) ×3 IMPLANT
SUT VICRYL 0 UR6 27IN ABS (SUTURE) ×3 IMPLANT
SUT VICRYL AB 3-0 FS1 BRD 27IN (SUTURE) ×3 IMPLANT
TRAY FOLEY W/METER SILVER 16FR (SET/KITS/TRAYS/PACK) ×3 IMPLANT
TROCAR XCEL 12X100 BLDLESS (ENDOMECHANICALS) ×3 IMPLANT
TROCAR XCEL NON-BLD 5MMX100MML (ENDOMECHANICALS) ×3 IMPLANT
TUBING INSUFFLATION (TUBING) ×3 IMPLANT

## 2016-08-05 NOTE — ED Notes (Signed)
[place] [facility] told to [place] Flagyl now and send Rocephin to OR

## 2016-08-05 NOTE — ED Notes (Signed)
Called Med[facility] and gave brief report. This RN attempting to contact OR at this time for further information.

## 2016-08-05 NOTE — ED Notes (Addendum)
[place] unsure what time patient will go to surgery, will request atbx to be ready for OR use.  Updated patient on possible surgery time after 6pm.

## 2016-08-05 NOTE — Consult Note (Signed)
SURGICAL CONSULTATION NOTE (initial) - cpt: [phone]  HISTORY OF PRESENT ILLNESS (HPI):  26 y.o. female presented [facility] ED with focal RLQ abdominal pain that began this morning after she ate breakfast. When she woke this morning, she was hungover after being intoxicated the prior night, but she describes the RLQ abdominal pain became worse rather than better, prompting her to seek further evaluation and management. As the pain worsened, she also began to experience some nausea without emesis. Patient denies any prior similar experiences or any prior abdominal surgeries and denies fever/chills, CP, or SOB. While in the Emergency Department, patient says her pain has been overall well-controlled. Patient says she last ate early this morning (>6 hours ago) and asks when she'll be able to eat again.  Surgery is consulted by ED physician Dr. Shaune PollackLord in this context for evaluation and management of acute appendicitis.  PAST MEDICAL HISTORY (PMH):  Past Medical History:  Diagnosis Date  . Bacterial vaginosis   . Bacterial vaginosis   . History of chlamydia      PAST SURGICAL HISTORY (PSH):  History reviewed. No pertinent surgical history.   MEDICATIONS:  Prior to Admission medications   Not on File     ALLERGIES:  No Known Allergies   SOCIAL HISTORY:  Social History   Social History  . Marital status: Single    Spouse name: N/A  . Number of children: N/A  . Years of education: N/A   Occupational History  . Not on file.   Social History Main Topics  . Smoking status: Never Smoker  . Smokeless tobacco: Never Used  . Alcohol use No  . Drug use: No  . Sexual activity: Yes    Birth control/ protection: None   Other Topics Concern  . Not on file   Social History Narrative   ** Merged History Encounter **        The patient currently resides (home / rehab facility / nursing home): Home  The patient normally is (ambulatory / bedbound): Ambulatory   FAMILY HISTORY:  Family  History  Problem Relation Age of Onset  . Cancer Maternal Grandfather        colon  . Hypertension Maternal Grandfather     REVIEW OF SYSTEMS:  Constitutional: denies weight loss, fever, chills, or sweats  Eyes: denies any other vision changes, history of eye injury  ENT: denies sore throat, hearing problems  Respiratory: denies shortness of breath, wheezing  Cardiovascular: denies chest pain, palpitations  Gastrointestinal: abdominal pain, N/V, and bowel function as per HPI Genitourinary: denies burning with urination or urinary frequency Musculoskeletal: denies any other joint pains or cramps  Skin: denies any other rashes or skin discolorations  Neurological: denies any other headache, dizziness, weakness  Psychiatric: denies any other depression, anxiety   All other review of systems were negative   VITAL SIGNS:  Temp:  [99 F (37.2 C)] 99 F (37.2 C) (07/21 0924) Pulse Rate:  [71-102] 71 (07/21 1400) Resp:  [18] 18 (07/21 1400) BP: (111-128)/(82-89) 111/82 (07/21 1400) SpO2:  [97 %-100 %] 100 % (07/21 1400) Weight:  [160 lb (72.6 kg)] 160 lb (72.6 kg) (07/21 0924)     Height: 5\' 1"  (154.9 cm) Weight: 160 lb (72.6 kg) BMI (Calculated): 30.3   INTAKE/OUTPUT:  This shift: No intake/output data recorded.  Last 2 shifts: @IOLAST2SHIFTS @   PHYSICAL EXAM:  Constitutional:  -- Overweight body habitus  -- Awake, alert, and oriented x3  Eyes:  -- Pupils equally round  and reactive to light  -- No scleral icterus  Ear, nose, and throat:  -- No jugular venous distension  Pulmonary:  -- No crackles  -- Equal breath sounds bilaterally -- Breathing non-labored at rest Cardiovascular:  -- S1, S2 present  -- No pericardial rubs Gastrointestinal:  -- Abdomen soft and nondistended with mild RLQ abdominal tenderness to palpation, no guarding/rebound  -- No abdominal masses appreciated, pulsatile or otherwise  Musculoskeletal and Integumentary:  -- Wounds or skin discoloration:  None appreciated -- Extremities: B/L UE and LE FROM, hands and feet warm, no edema  Neurologic:  -- Motor function: intact and symmetric -- Sensation: intact and symmetric  Labs:  CBC:  Lab Results  Component Value Date   WBC 14.9 (H) 08/05/2016   RBC 4.63 08/05/2016   BMP:  Lab Results  Component Value Date   GLUCOSE 96 08/05/2016   CO2 23 08/05/2016   BUN 11 08/05/2016   CREATININE 0.71 08/05/2016   CALCIUM 8.9 08/05/2016     Imaging studies:  CT Abdomen and Pelvis with Contrast (08/05/2016) Stomach and small bowel are unremarkable. The colon is unremarkable.  Appendix is enlarged measuring 11 mm in diameter with mucosal enhancement. There is very subtle adjacent inflammatory change and no significant free fluid in the right lower quadrant. Mild free fluid in the pelvis. Findings are likely due to early acute appendicitis. No evidence of perforation or abscess. Few small mesenteric lymph nodes in the right abdomen.  Assessment/Plan: (ICD-10's: K35.3) 26 y.o. female with acute appendicitis with localized peritonitis, complicated by pertinent comorbidities including obesity (BMI >30) and a history of STD's including bacterial vaginosis and chlamydia without known PID.   - NPO, IVF   - IV antibiotics (ceftriaxone and metronidazole)   - all risks, benefits, and alternatives to appendectomy were discussed with the patient and her family (cousin bedside), all of her questions were answered to her expressed satisfaction, patient expresses she wishes to proceed, and informed consent was accordingly obtained.  - will plan for laparoscopic appendectomy today  - pain control prn  All of the above findings and recommendations were discussed with the patient and her family, and all of patient's and her family's questions were answered to their expressed satisfaction.  Thank you for the opportunity to participate in this patient's care.   -- Scherrie Gerlach Earlene Plater, MD, RPVI [place]:  [facility] General Surgery - Partnering for exceptional care. Office: [phone]

## 2016-08-05 NOTE — Anesthesia Procedure Notes (Signed)
Procedure Name: Intubation Performed by: Demetrius Charity Pre-anesthesia Checklist: Patient identified, Patient being monitored, Timeout performed, Emergency Drugs available and Suction available Patient Re-evaluated:Patient Re-evaluated prior to induction Oxygen Delivery Method: Circle system utilized Preoxygenation: Pre-oxygenation with 100% oxygen Induction Type: IV induction, Rapid sequence and Cricoid Pressure applied Laryngoscope Size: Mac and 3 Grade View: Grade I Tube type: Oral Tube size: 7.0 mm Number of attempts: 1 Airway Equipment and Method: Stylet Placement Confirmation: ETT inserted through vocal cords under direct vision,  positive ETCO2 and breath sounds checked- equal and bilateral Secured at: 21 cm Tube secured with: Tape Dental Injury: Teeth and Oropharynx as per pre-operative assessment

## 2016-08-05 NOTE — Anesthesia Preprocedure Evaluation (Signed)
Anesthesia Evaluation  Patient identified by MRN, date of birth, ID band Patient awake    Reviewed: Allergy & Precautions, H&P , NPO status , Patient's Chart, lab work & pertinent test results, reviewed documented beta blocker date and time   History of Anesthesia Complications Negative for: history of anesthetic complications  Airway Mallampati: III  TM Distance: >3 FB Neck ROM: full    Dental  (+) Missing, Teeth Intact, Dental Advidsory Given   Pulmonary Current Smoker,           Cardiovascular Exercise Tolerance: Good negative cardio ROS       Neuro/Psych negative neurological ROS  negative psych ROS   GI/Hepatic negative GI ROS, Neg liver ROS,   Endo/Other  negative endocrine ROS  Renal/GU negative Renal ROS  negative genitourinary   Musculoskeletal   Abdominal   Peds  Hematology negative hematology ROS (+)   Anesthesia Other Findings Past Medical History: No date: Bacterial vaginosis No date: Bacterial vaginosis No date: History of chlamydia   Reproductive/Obstetrics negative OB ROS                             Anesthesia Physical Anesthesia Plan  ASA: I  Anesthesia Plan: General   Post-op Pain Management:    Induction: Intravenous  PONV Risk Score and Plan: 3 and Ondansetron, Dexamethasone and Midazolam  Airway Management Planned: Oral ETT  Additional Equipment:   Intra-op Plan:   Post-operative Plan: Extubation in OR  Informed Consent: I have reviewed the patients History and Physical, chart, labs and discussed the procedure including the risks, benefits and alternatives for the proposed anesthesia with the patient or authorized representative who has indicated his/her understanding and acceptance.   Dental Advisory Given  Plan Discussed with: Anesthesiologist, CRNA and Surgeon  Anesthesia Plan Comments:         Anesthesia Quick Evaluation

## 2016-08-05 NOTE — ED Provider Notes (Signed)
[facility] Emergency Department Provider Note ____________________________________________   I have reviewed the triage vital signs and the triage nursing note.  HISTORY  Chief Complaint Abdominal Pain   Historian Patient  HPI Alyssa Olson is a 26 y.o. female with a history of bacterial vaginosis and Chlamydia, presents today with right lower quadrant pain which started yesterday and is still persistent today. States that she started her period this morning but has not had vaginal discharge otherwise. Pain is on the right side of the abdomen and more lower than upper. No history of abdominal surgeries. Pain is moderate at present. Nothing makes it worse or better.  No vomiting or fevers.    Past Medical History:  Diagnosis Date  . Bacterial vaginosis   . Bacterial vaginosis   . History of chlamydia     Patient Active Problem List   Diagnosis Date Noted  . Chlamydia 04/13/2015    History reviewed. No pertinent surgical history.  Prior to Admission medications   Not on File    No Known Allergies  Family History  Problem Relation Age of Onset  . Cancer Maternal Grandfather        colon  . Hypertension Maternal Grandfather     Social History Social History  Substance Use Topics  . Smoking status: Never Smoker  . Smokeless tobacco: Never Used  . Alcohol use No    Review of Systems  Constitutional: Negative for fever. Eyes: Negative for visual changes. ENT: Negative for sore throat. Cardiovascular: Negative for chest pain. Respiratory: Negative for shortness of breath. Gastrointestinal: Negative for vomiting and diarrhea. Genitourinary: Negative for dysuria. Musculoskeletal: Negative for back pain. Skin: Negative for rash. Neurological: Negative for headache.  ____________________________________________   PHYSICAL EXAM:  VITAL SIGNS: ED Triage Vitals  Enc Vitals Group     BP 08/05/16 0924 128/88     Pulse Rate  08/05/16 0924 (!) 102     Resp 08/05/16 0924 18     Temp 08/05/16 0924 99 F (37.2 C)     Temp Source 08/05/16 0924 Oral     SpO2 08/05/16 0924 98 %     Weight 08/05/16 0924 160 lb (72.6 kg)     Height 08/05/16 0924 5\' 1"  (1.549 m)     Head Circumference --      Peak Flow --      Pain Score 08/05/16 0923 7     Pain Loc --      Pain Edu? --      Excl. in GC? --      Constitutional: Alert and oriented. Well appearing and in no distress. HEENT   Head: Normocephalic and atraumatic.      Eyes: Conjunctivae are normal. Pupils equal and round.       Ears:         Nose: No congestion/rhinnorhea.   Mouth/Throat: Mucous membranes are moist.   Neck: No stridor. Cardiovascular/Chest: Normal rate, regular rhythm.  No murmurs, rubs, or gallops. Respiratory: Normal respiratory effort without tachypnea nor retractions. Breath sounds are clear and equal bilaterally. No wheezes/rales/rhonchi. Gastrointestinal: Soft. No distention, no guarding, no rebound. Moderate tenderness to palpation in the right lower quadrant.  Genitourinary/rectal: Small amount of vaginal bleeding from the os. No cervicitis clinically. Musculoskeletal: Nontender with normal range of motion in all extremities. No joint effusions.  No lower extremity tenderness.  No edema. Neurologic:  Normal speech and language. No gross or focal neurologic deficits are appreciated. Skin:  Skin is warm,  dry and intact. No rash noted. Psychiatric: Mood and affect are normal. Speech and behavior are normal. Patient exhibits appropriate insight and judgment.   ____________________________________________  LABS (pertinent positives/negatives)  Labs Reviewed  CBC - Abnormal; Notable for the following:       Result Value   WBC 14.9 (*)    All other components within normal limits  URINALYSIS, COMPLETE (UACMP) WITH MICROSCOPIC - Abnormal; Notable for the following:    Color, Urine YELLOW (*)    APPearance CLEAR (*)    Hgb urine  dipstick LARGE (*)    Squamous Epithelial / LPF 0-5 (*)    All other components within normal limits  CHLAMYDIA/NGC RT PCR ([facility] ONLY)  WET PREP, GENITAL  LIPASE, BLOOD  COMPREHENSIVE METABOLIC PANEL  POCT PREGNANCY, URINE    ____________________________________________    EKG I, Governor Rooksebecca Dana Dorner, MD, the attending physician have personally viewed and interpreted all ECGs.  None ____________________________________________  RADIOLOGY All Xrays were viewed by me. CT discussed with the radiologist prior to computerized reading available  CT abdomen and pelvis with contrast: Consistent with dilated appendix, concerning for acute appendicitis. __________________________________________  PROCEDURES  Procedure(s) performed: None  Critical Care performed: None  ____________________________________________   ED COURSE / ASSESSMENT AND PLAN  Pertinent labs & imaging results that were available during my care of the patient were reviewed by me and considered in my medical decision making (see chart for details).    Right lower quadrant , somewhat concerning for acute appendicitis especially with elevated white blood cell count.  Pelvic exam without cervicitis.  CT scan called to me by the reading radiologist consistent with early appendicitis, with dilated appendix.  I spoke with Dr. Earlene Plateravis with general surgery who will come see the patient. I updated the patient. She is currently symptom control at present.    CONSULTATIONS:   Dr. Earlene Plateravis, general surgery to see the patient for acute appendicitis.   Patient / Family / Caregiver informed of clinical course, medical decision-making process, and agree with plan.  ___________________________________________   FINAL CLINICAL IMPRESSION(S) / ED DIAGNOSES   Final diagnoses:  Acute right lower quadrant pain  Acute appendicitis with localized peritonitis              Note: This dictation was prepared  with Dragon dictation. Any transcriptional errors that result from this process are unintentional    Governor RooksLord, Dewon Mendizabal, MD 08/05/16 1446

## 2016-08-05 NOTE — ED Triage Notes (Signed)
Pt reports that she began having RLQ abd pain last night with some nausea. Denies vomiting. Pt reports pain worsens with palpation and with standing straight.

## 2016-08-05 NOTE — Anesthesia Post-op Follow-up Note (Cosign Needed)
Anesthesia QCDR form completed.        

## 2016-08-05 NOTE — ED Notes (Signed)
Abdominal pain since yesterday, c/o nausea and diarrhea yesterday.  Pt c/o RUQ abdominal pain and tender to the touch.

## 2016-08-05 NOTE — Op Note (Signed)
  Procedure Date:  08/05/2016  Pre-operative Diagnosis:  Acute appendicitis  Post-operative Diagnosis:  Acute appendicitis  Procedure:  Laparoscopic appendectomy  Surgeon:  Howie IllJose Luis Lealon Vanputten, MD  Anesthesia:  General endotracheal  Estimated Blood Loss:  5 ml  Specimens:  appendix  Complications:  None  Indications for Procedure:  This is a 26 y.o. female who presents with abdominal pain and workup revealing acute appendicitis.  The options of surgery versus observation were reviewed with the patient and/or family. The risks of bleeding, infection, recurrence of symptoms, negative laparoscopy, potential for an open procedure, bowel injury, abscess or infection, were all discussed with the patient and she was willing to proceed.  Description of Procedure: The patient was correctly identified in the preoperative area and brought into the operating room.  The patient was placed supine with VTE prophylaxis in place.  Appropriate time-outs were performed.  Anesthesia was induced and the patient was intubated.  Foley catheter was placed.  Appropriate antibiotics were infused.  The abdomen was prepped and draped in a sterile fashion. An infraumbilical incision was made. A cutdown technique was used to enter the abdominal cavity without injury, and a Hasson trocar was inserted.  Pneumoperitoneum was obtained with appropriate opening pressures.  Two 5-mm ports were placed in the suprapubic and left lateral positions under direct visualization.  The right lower quadrant was inspected and the appendix was identified and found to be acutely inflamed but not perforated.  The appendix was carefully dissected. The base of the appendix was dissected out and divided with a standard load Endo GIA. The mesoappendix was divided using the Harmonic.  The appendix was placed in an Endocatch bag and brought out through the umbilical incision.  The right lower quadrant was then inspected again revealing an intact  staple line, no bleeding, and no bowel injury.  The area was thoroughly irrigated.  The 5 mm ports were removed under direct visualization and the Hasson trocar was removed.  The fascial opening was closed using 0 vicryl suture.  Local anesthetic was infused in all incisions and the incisions were closed with 4-0 Monocryl.  The wounds were cleaned and sealed with DermaBond.  Foley catheter was removed and the patient was emerged from anesthesia and extubated and brought to the recovery room for further management.  The patient tolerated the procedure well and all counts were correct at the end of the case.   Howie IllJose Luis Elmond Poehlman, MD

## 2016-08-05 NOTE — Transfer of Care (Signed)
Immediate Anesthesia Transfer of Care Note  Patient: Alyssa Olson  Procedure(s) Performed: Procedure(s): APPENDECTOMY LAPAROSCOPIC (N/A)  Patient Location: PACU  Anesthesia Type:General  Level of Consciousness: awake, alert  and oriented  Airway & Oxygen Therapy: Patient Spontanous Breathing and Patient connected to face mask oxygen  Post-op Assessment: Report given to RN and Post -op Vital signs reviewed and stable  Post vital signs: Reviewed and stable  Last Vitals:  Vitals:   08/05/16 1948 08/05/16 2000  BP: 105/75 117/87  Pulse: 74 80  Resp: 18   Temp:      Last Pain:  Vitals:   08/05/16 1948  TempSrc:   PainSc: 0-No pain         Complications: No apparent anesthesia complications

## 2016-08-05 NOTE — H&P (Signed)
SURGICAL ADMISSION HISTORY & PHYSICAL  HISTORY OF PRESENT ILLNESS (HPI):  26 y.o. female presented to [facility] ED with focal RLQ abdominal pain that began this morning after she ate breakfast. When she woke this morning, she was hungover after being intoxicated the prior night, but she describes the RLQ abdominal pain became worse rather than better, prompting her to seek further evaluation and management. As the pain worsened, she also began to experience some nausea without emesis. Patient denies any prior similar experiences or any prior abdominal surgeries and denies fever/chills, CP, or SOB. While in the Emergency Department, patient says her pain has been overall well-controlled. Patient says she last ate early this morning (>6 hours ago) and asks when she'll be able to eat again.  Surgery was consulted by ED physician Dr. Shaune PollackLord in this context for evaluation and management of acute appendicitis.  PAST MEDICAL HISTORY (PMH):  Past Medical History:  Diagnosis Date  . Bacterial vaginosis   . Bacterial vaginosis   . History of chlamydia      PAST SURGICAL HISTORY (PSH):  History reviewed. No pertinent surgical history.   MEDICATIONS:  Prior to Admission medications   Not on File     ALLERGIES:  No Known Allergies   SOCIAL HISTORY:  Social History   Social History  . Marital status: Single    Spouse name: N/A  . Number of children: N/A  . Years of education: N/A   Occupational History  . Not on file.   Social History Main Topics  . Smoking status: Never Smoker  . Smokeless tobacco: Never Used  . Alcohol use No  . Drug use: No  . Sexual activity: Yes    Birth control/ protection: None   Other Topics Concern  . Not on file   Social History Narrative   ** Merged History Encounter **        The patient currently resides (home / rehab facility / nursing home): Home  The patient normally is (ambulatory / bedbound): Ambulatory   FAMILY HISTORY:  Family History   Problem Relation Age of Onset  . Cancer Maternal Grandfather        colon  . Hypertension Maternal Grandfather     REVIEW OF SYSTEMS:  Constitutional: denies weight loss, fever, chills, or sweats  Eyes: denies any other vision changes, history of eye injury  ENT: denies sore throat, hearing problems  Respiratory: denies shortness of breath, wheezing  Cardiovascular: denies chest pain, palpitations  Gastrointestinal: abdominal pain, N/V, and bowel function as per HPI Genitourinary: denies burning with urination or urinary frequency Musculoskeletal: denies any other joint pains or cramps  Skin: denies any other rashes or skin discolorations  Neurological: denies any other headache, dizziness, weakness  Psychiatric: denies any other depression, anxiety   All other review of systems were negative   VITAL SIGNS:  Temp:  [99 F (37.2 C)] 99 F (37.2 C) (07/21 0924) Pulse Rate:  [71-102] 71 (07/21 1400) Resp:  [18] 18 (07/21 1400) BP: (111-128)/(82-89) 111/82 (07/21 1400) SpO2:  [97 %-100 %] 100 % (07/21 1400) Weight:  [160 lb (72.6 kg)] 160 lb (72.6 kg) (07/21 0924)     Height: 5\' 1"  (154.9 cm) Weight: 160 lb (72.6 kg) BMI (Calculated): 30.3   INTAKE/OUTPUT:  This shift: No intake/output data recorded.  Last 2 shifts: @IOLAST2SHIFTS @   PHYSICAL EXAM:  Constitutional:  -- Overweight body habitus  -- Awake, alert, and oriented x3  Eyes:  -- Pupils equally round and reactive  to light  -- No scleral icterus  Ear, nose, and throat:  -- No jugular venous distension  Pulmonary:  -- No crackles  -- Equal breath sounds bilaterally -- Breathing non-labored at rest Cardiovascular:  -- S1, S2 present  -- No pericardial rubs Gastrointestinal:  -- Abdomen soft and nondistended with mild RLQ abdominal tenderness to palpation, no guarding/rebound  -- No abdominal masses appreciated, pulsatile or otherwise  Musculoskeletal and Integumentary:  -- Wounds or skin discoloration: None  appreciated -- Extremities: B/L UE and LE FROM, hands and feet warm, no edema  Neurologic:  -- Motor function: intact and symmetric -- Sensation: intact and symmetric  Labs:  CBC:  Lab Results  Component Value Date   WBC 14.9 (H) 08/05/2016   RBC 4.63 08/05/2016   BMP:  Lab Results  Component Value Date   GLUCOSE 96 08/05/2016   CO2 23 08/05/2016   BUN 11 08/05/2016   CREATININE 0.71 08/05/2016   CALCIUM 8.9 08/05/2016     Imaging studies:  CT Abdomen and Pelvis with Contrast (08/05/2016) Stomach and small bowel are unremarkable. The colon is unremarkable.  Appendix is enlarged measuring 11 mm in diameter with mucosal enhancement. There is very subtle adjacent inflammatory change and no significant free fluid in the right lower quadrant. Mild free fluid in the pelvis. Findings are likely due to early acute appendicitis. No evidence of perforation or abscess. Few small mesenteric lymph nodes in the right abdomen.  Assessment/Plan: (ICD-10's: K35.3) 26 y.o. female with acute appendicitis with localized peritonitis, complicated by pertinent comorbidities including obesity (BMI >30) and a history of STD's including bacterial vaginosis and chlamydia without known PID.   - NPO, IVF   - IV antibiotics (ceftriaxone and metronidazole)   - all risks, benefits, and alternatives to appendectomy were discussed with the patient and her family (cousin bedside), all of her questions were answered to her expressed satisfaction, patient expresses she wishes to proceed, and informed consent was accordingly obtained.  - will plan for laparoscopic appendectomy today  - pain control prn  All of the above findings and recommendations were discussed with the patient and her family, and all of patient's and her family's questions were answered to their expressed satisfaction.  -- Scherrie Gerlach Earlene Plater, MD, RPVI [place]: [facility] General Surgery - Partnering for exceptional  care. Office: [phone]

## 2016-08-05 NOTE — ED Notes (Signed)
CT notified patient finished with contrast 

## 2016-08-05 NOTE — ED Notes (Signed)
Pt states she has not had anything to drink today besides the contrast dye and has not eaten since yesterday morning.

## 2016-08-06 ENCOUNTER — Encounter: Payer: Self-pay | Admitting: Surgery

## 2016-08-06 MED ORDER — OXYCODONE HCL 5 MG PO TABS: 5.0000 mg | ORAL_TABLET | ORAL | 0 refills | Status: DC | PRN

## 2016-08-06 NOTE — Discharge Summary (Signed)
Physician Discharge Summary  Patient ID: Rennis Goldenmber L ROGERS-LEATH MRN: [id number] DOB/AGE: 25/17/1992 25 y.o.  Admit date: 08/05/2016 Discharge date: 08/06/2016  Admission Diagnoses:  Discharge Diagnoses:  Principal Problem:   Acute appendicitis   Discharged Condition: good  Hospital Course: 26 year old Female presented [facility] ED with focal RLQ abdominal pain < 24 hours. Workup was found to be significant for WBC 14.9 and CT demonstrating acute non-perforated appendicitis. Informed consent was obtained, and patient underwent uneventful laparoscopic appendectomy (Piscoya, 7/21). Post-operatively, patient denied any pain and the remainder of her admission was unremarkable with her tolerating advancement of her diet and ambulation with discharge planning initiated accordingly. Patient was then safely able to be discharged home with appropriate instructions, follow-up, and pain medication.  Consults: None  Significant Diagnostic Studies: radiology: CT scan: acute appendicitis  Treatments: IV hydration, antibiotics: ceftriaxone and metronidazole and surgery: laparoscopic appendectomy  Discharge Exam: Blood pressure (!) 94/50, pulse 65, temperature 97.8 F (36.6 C), temperature source Oral, resp. rate 16, height 5\' 1"  (1.549 m), weight 160 lb (72.6 kg), last menstrual period 08/05/2016, SpO2 98 %. General appearance: alert, cooperative and no distress GI: abdomen soft and overweight, but non-distended, and completely non-tender with incisions well-approximated without erythema or drainage  Disposition: 01-Home or Self Care   Allergies as of 08/06/2016   No Known Allergies     Medication List    TAKE these medications   oxyCODONE 5 MG immediate release tablet Commonly known as:  Oxy IR/ROXICODONE Take 1-2 tablets (5-10 mg total) by mouth every 4 (four) hours as needed for severe pain.      Follow-up Information    [facility]. Schedule an appointment  as soon as possible for a visit in 2 week(s).   Specialty:  General Surgery Contact information: [address] [place] [id number] [phone]          Signed: Ancil LinseyJason Evan Davis 08/06/2016, 1:50 PM

## 2016-08-06 NOTE — Anesthesia Postprocedure Evaluation (Signed)
Anesthesia Post Note  Patient: Alyssa Olson  Procedure(s) Performed: Procedure(s) (LRB): APPENDECTOMY LAPAROSCOPIC (N/A)  Patient location during evaluation: PACU Anesthesia Type: General Level of consciousness: awake and alert Pain management: pain level controlled Vital Signs Assessment: post-procedure vital signs reviewed and stable Respiratory status: spontaneous breathing, nonlabored ventilation, respiratory function stable and patient connected to nasal cannula oxygen Cardiovascular status: blood pressure returned to baseline and stable Postop Assessment: no signs of nausea or vomiting Anesthetic complications: no     Last Vitals:  Vitals:   08/06/16 0018 08/06/16 0504  BP: 102/63 (!) 94/50  Pulse: (!) 59 65  Resp: 18 16  Temp: 36.7 C 36.6 C    Last Pain:  Vitals:   08/06/16 0504  TempSrc: Oral  PainSc: 0-No pain                 Lenard SimmerAndrew Tanessa Tidd

## 2016-08-06 NOTE — Discharge Instructions (Signed)
In addition to included general post-operative instructions for Laparoscopic Appendectomy,  Diet: Resume home heart healthy diet.   Activity: No heavy lifting >20 pounds (children, pets, laundry, garbage) or strenuous activity until follow-up, but light activity and walking are encouraged. Do not drive or drink alcohol if taking narcotic pain medications.  Wound care: 2 days after surgery (Monday, 7/23), may shower/get incision wet with soapy water and pat dry (do not rub incisions), but no baths or submerging incision underwater until follow-up.   Medications: Resume all home medications. For mild to moderate pain: acetaminophen (Tylenol) or ibuprofen (if no kidney disease). Combining Tylenol with alcohol can substantially increase your risk of causing liver disease. Narcotic pain medications, if prescribed, can be used for severe pain, though may cause nausea, constipation, and drowsiness. Do not combine Tylenol and Percocet within a 6 hour period as Percocet contains Tylenol. If you do not need the narcotic pain medication, you do not need to fill the prescription.  Call office [phone]) at any time if any questions, worsening pain, fevers/chills, bleeding, drainage from incision site, or other concerns.

## 2016-08-08 LAB — SURGICAL PATHOLOGY

## 2016-08-09 ENCOUNTER — Telehealth: Payer: Self-pay

## 2016-08-09 NOTE — Telephone Encounter (Signed)
Disability forms came in for this patient. Please let Nicholaus BloomKelley know when this is completed so she can collect a payment and fax the documents

## 2016-08-10 ENCOUNTER — Telehealth: Payer: Self-pay

## 2016-08-10 NOTE — Telephone Encounter (Signed)
.  aspost

## 2016-08-10 NOTE — Telephone Encounter (Signed)
Post-op call made to patient at this time. Spoke with . Post-op interview questions below.  1. How are you feeling? She was feeling better and then started having some pain in the largest incision and yellowish bloody drainage. Glue still intact.  2. Is your pain controlled? No pain is a 6 at this time.  3. What are you doing for the pain? Oxycodone 1 pill a day.  4. Are you having any Nausea or Vomiting? no  5. Are you having any Fever or Chills? no  6. Are you having any Constipation or Diarrhea? Diarrhea 2 days ago. Patient will take stool softenner  7. Is there any Swelling or Bruising you are concerned about? Minimal swelling , but not worrisome.  8. Do you have any questions or concerns at this time?    Discussion: Appetite improving daily. Patient instructed to call if redness around incision develops or fever.  Patient instructed to take 1/2 oxycodone in am and the other 1/2 in pm, or use Ibuprofen or Tylenol instead. Patient was reminded of appointment on 08/21/16 @ 10:15 am.

## 2016-08-10 NOTE — Telephone Encounter (Signed)
Patient's disability form was filled out and faxed. Please collect the $25 fee. Thank you.

## 2016-08-18 ENCOUNTER — Other Ambulatory Visit: Payer: Self-pay

## 2016-08-21 ENCOUNTER — Encounter: Payer: Managed Care, Other (non HMO) | Admitting: General Surgery

## 2016-08-22 ENCOUNTER — Ambulatory Visit (INDEPENDENT_AMBULATORY_CARE_PROVIDER_SITE_OTHER): Payer: 59 | Admitting: General Surgery

## 2016-08-22 ENCOUNTER — Encounter: Payer: Self-pay | Admitting: General Surgery

## 2016-08-22 VITALS — BP 124/90 | HR 102 | Temp 98.8°F | Wt 154.0 lb

## 2016-08-22 DIAGNOSIS — Z4889 Encounter for other specified surgical aftercare: Secondary | ICD-10-CM

## 2016-08-22 NOTE — Patient Instructions (Signed)
Please do not submerge in a tub, hot tub, or pool until incisions are completely sealed.  Use sun block to incision area over the next year if this area will be exposed to sun. This helps decrease scarring.  You may resume your normal activities on 08/27/2016. At that time- Listen to your body when lifting, if you have pain when lifting, stop and then try again in a few days. Soreness after doing exercises or activities of daily living is normal as you get back in to your normal routine.  If you develop redness, drainage, or pain at incision sites- call our office immediately and speak with a nurse.  Please call our office with any questions or concerns that you may have.

## 2016-08-22 NOTE — Progress Notes (Signed)
Outpatient Surgical Follow Up  08/22/2016  Alyssa Olson is an 26 y.o. female.   Chief Complaint  Patient presents with  . Routine Post Op    Laparoscopic Appendectomy 08/05/16 Dr. Aleen CampiPiscoya    HPI: [age] old female returns to clinic now 2-1/2 weeks status post laparoscopic appendectomy. Patient reports she continues to feel full quickly and have diarrhea after eating. She denies any real abdominal pain. She's having some mild drainage from her umbilical site but otherwise her incisions look good. She denies any fevers, chills, nausea, vomiting, chest pain, shortness of breath.  Past Medical History:  Diagnosis Date  . Acute appendicitis   . Bacterial vaginosis   . Bacterial vaginosis   . History of chlamydia     Past Surgical History:  Procedure Laterality Date  . LAPAROSCOPIC APPENDECTOMY N/A 08/05/2016   Procedure: APPENDECTOMY LAPAROSCOPIC;  Surgeon: Alyssa DodgePiscoya, Jose, MD;  Location: [facility] ORS;  Service: General;  Laterality: N/A;    Family History  Problem Relation Age of Onset  . Cancer Maternal Grandfather        colon  . Hypertension Maternal Grandfather     Social History:  reports that she has never smoked. She has never used smokeless tobacco. She reports that she does not drink alcohol or use drugs.  Allergies: No Known Allergies  Medications reviewed.    ROS A multipoint review of systems was complete, all pertinent positives and negatives are documented within the history of present illness and remainder are negative   BP 124/90   Pulse (!) 102   Temp 98.8 F (37.1 C) (Oral)   Wt 69.9 kg (154 lb)   LMP 08/05/2016 (Exact Date)   BMI 29.10 kg/m   Physical Exam Gen.: No acute distress Chest: Clear to auscultation Heart: Regular rhythm Abdomen: Soft, nontender, nondistended. Umbilical site with a 3 x 2 mm opening with what appears to be minimal granulation tissue and some purulence.    No results found for this or any previous visit (from the past 48  hour(s)). No results found.  Assessment/Plan:  1. Aftercare following surgery [age] old female status post laparoscopic appendectomy. Pathology reviewed with the patient. Her wounds were cleaned in clinic today and treated with some nitrate. Discussed signs and symptoms of infection with the patient for her to return to clinic immediately should they occur. Otherwise she'll follow-up in clinic next week for 1 additional wound check prior to being released to work.      Alyssa Frameharles Tieler Cournoyer, MD FACS General Surgeon  08/22/2016,10:06 AM

## 2016-08-23 ENCOUNTER — Telehealth: Payer: Self-pay

## 2016-08-23 NOTE — Telephone Encounter (Signed)
Patient called wanting to know if it was normal for her to bleed from her umbilical incision. I asked her if she was bleeding more than an ounce from her gauze within in an hour and she stated that she was not. She stated that yesterday she had some blood on the bandage that we had applied after Dr. Tonita CongWoodham applied silver nitrate. She stated that when she got home she replace the bandage with a piece of gauze and that during the night she bled a little. I told her that it was normal but if she started to bleed more to give us a call. Patient understood and had no further questions.

## 2016-08-28 ENCOUNTER — Encounter: Payer: Self-pay | Admitting: Surgery

## 2016-08-28 ENCOUNTER — Ambulatory Visit (INDEPENDENT_AMBULATORY_CARE_PROVIDER_SITE_OTHER): Payer: 59 | Admitting: Surgery

## 2016-08-28 VITALS — BP 134/89 | HR 125 | Temp 98.5°F | Ht 61.0 in | Wt 156.0 lb

## 2016-08-28 DIAGNOSIS — Z4889 Encounter for other specified surgical aftercare: Secondary | ICD-10-CM

## 2016-08-28 NOTE — Patient Instructions (Signed)
Please see your follow up appointment listed below.  °

## 2016-08-28 NOTE — Progress Notes (Signed)
Outpatient postop visit  08/28/2016  Alyssa SlickerAmber L Rikki Olson is an 26 y.o. female.    Procedure: Laparoscopic appendectomy  CC: Minimal drainage  HPI: 1. The umbilical wound was treated with silver nitrate previously she is doing quite well at this point tolerating regular diet with no fevers or chills  Medications reviewed.    Physical Exam:  BP 134/89   Pulse (!) 125   Temp 98.5 F (36.9 C) (Oral)   Ht 5\' 1"  (1.549 m)   Wt 156 lb (70.8 kg)   LMP 08/05/2016 (Exact Date)   BMI 29.48 kg/m     PE: Granulation tissue present and a 3 x 3 mm area of the periumbilical wound. This is treated with silver nitrate no erythema no sign of infection    Assessment/Plan:  Patient doing very well silver nitrate is applied*come back next week for reevaluation.  Lattie Hawichard E Lindia Garms, MD, FACS

## 2016-09-04 ENCOUNTER — Ambulatory Visit: Payer: 59 | Admitting: Surgery

## 2016-09-04 ENCOUNTER — Encounter: Payer: Self-pay | Admitting: Surgery

## 2016-09-04 ENCOUNTER — Ambulatory Visit (INDEPENDENT_AMBULATORY_CARE_PROVIDER_SITE_OTHER): Payer: 59 | Admitting: Certified Nurse Midwife

## 2016-09-04 ENCOUNTER — Encounter: Payer: Self-pay | Admitting: Certified Nurse Midwife

## 2016-09-04 VITALS — BP 125/87 | HR 118 | Temp 98.7°F | Ht 61.0 in | Wt 155.4 lb

## 2016-09-04 VITALS — BP 115/78 | HR 101 | Ht 61.0 in | Wt 155.4 lb

## 2016-09-04 DIAGNOSIS — N898 Other specified noninflammatory disorders of vagina: Secondary | ICD-10-CM

## 2016-09-04 DIAGNOSIS — N6452 Nipple discharge: Secondary | ICD-10-CM | POA: Diagnosis not present

## 2016-09-04 MED ORDER — SULFAMETHOXAZOLE-TRIMETHOPRIM 800-160 MG PO TABS: 1.0000 | ORAL_TABLET | Freq: Two times a day (BID) | ORAL | 0 refills | Status: AC

## 2016-09-04 NOTE — Progress Notes (Signed)
GYN ENCOUNTER NOTE  Subjective:       Alyssa Olson is a 26 y.o. [id number] female is here for gynecologic evaluation of the following issues:  1. Nipple pain and swelling of right breast.  Alyssa Olson states that this started a week and a half ago. The Nipple is tender and ache with a pain score of 7/10. Nothing makes better. It is worse with touch. She also complains of increased vaginal discharge for the past few days. She denies itching, burning, and odor.    Gynecologic History Patient's last menstrual period was 08/05/2016 (exact date). Contraception: condoms Last Pap: 04/09/15. Results were: normal Last mammogram: N/A.  Obstetric History OB History  Gravida Para Term Preterm AB Living  2 0 0 0 1 1  SAB TAB Ectopic Multiple Live Births  0 1 0   1    # Outcome Date GA Lbr Len/2nd Weight Sex Delivery Anes PTL Lv  2 TAB 02/15/14          1 Gravida 10/23/13    M Vag-Spont   LIV      Past Medical History:  Diagnosis Date  . Acute appendicitis   . Bacterial vaginosis   . Bacterial vaginosis   . History of chlamydia     Past Surgical History:  Procedure Laterality Date  . LAPAROSCOPIC APPENDECTOMY N/A 08/05/2016   Procedure: APPENDECTOMY LAPAROSCOPIC;  Surgeon: Henrene Dodge, MD;  Location: [facility] ORS;  Service: General;  Laterality: N/A;    No current outpatient prescriptions on file prior to visit.   No current facility-administered medications on file prior to visit.     No Known Allergies  Social History   Social History  . Marital status: Single    Spouse name: N/A  . Number of children: N/A  . Years of education: N/A   Occupational History  . Not on file.   Social History Main Topics  . Smoking status: Current Every Day Smoker    Packs/day: 0.25    Types: Cigars  . Smokeless tobacco: Never Used  . Alcohol use No  . Drug use: No  . Sexual activity: Yes    Birth control/ protection: None   Other Topics Concern  . Not on file   Social History  Narrative   ** Merged History Encounter **        Family History  Problem Relation Age of Onset  . Cancer Maternal Grandfather        colon  . Hypertension Maternal Grandfather     The following portions of the patient's history were reviewed and updated as appropriate: allergies, current medications, past family history, past medical history, past social history, past surgical history and problem list.  Review of Systems Review of Systems - Negative except as mentioned in HPI Review of Systems - General ROS: negative for - chills, fatigue, fever, hot flashes, malaise or night sweats Hematological and Lymphatic ROS: negative for - bleeding problems or swollen lymph nodes Gastrointestinal ROS: negative for - abdominal pain, blood in stools, change in bowel habits and nausea/vomiting Musculoskeletal ROS: negative for - joint pain, muscle pain or muscular weakness Genito-Urinary ROS: negative for - change in menstrual cycle, dysmenorrhea, dyspareunia, dysuria, genital ulcers, hematuria, incontinence, irregular/heavy menses, nocturia or pelvic pain. Positive for increased vaginal discharge.  Objective:   BP 115/78   Pulse (!) 101   Ht 5\' 1"  (1.549 m)   Wt 155 lb 6.4 oz (70.5 kg)   LMP 08/05/2016 (Exact Date)   BMI  29.36 kg/m  CONSTITUTIONAL: Well-developed, well-nourished female in no acute distress.  HENT:  Normocephalic, atraumatic.  NECK: Normal range of motion, supple, no masses.  Normal thyroid.  SKIN: Skin is warm and dry. No rash noted. Not diaphoretic. No erythema. No pallor. NEUROLGIC: Alert and oriented to person, place, and time. PSYCHIATRIC: Normal mood and affect. Normal behavior. Normal judgment and thought content. CARDIOVASCULAR:Not Examined RESPIRATORY: Not Examined BREASTS: Right Breast: nipple is engorged/swollen. With visible papual with a white head on lower part of nipple. Left nipple normal ABDOMEN: Soft, non distended; Non tender.  No Organomegaly. PELVIC:  Not indicated MUSCULOSKELETAL: Normal range of motion. No tenderness.  No cyanosis, clubbing, or edema.     Assessment:   1. Nipple discharge - Anaerobic and Aerobic Culture  2. Vaginal discharge  NuSwab Vaginitis Plus (VG+)     Plan:   Treat with antibiotics, will follow up with culture results. Red flag symptoms reviewed. Follow as needed.  Doreene Burke, CNM

## 2016-09-04 NOTE — Patient Instructions (Signed)
Breast Cyst Drainage, Care After This sheet gives you information about how to care for yourself after your procedure. Your health care provider may also give you more specific instructions. If you have problems or questions, contact your health care provider. What can I expect after the procedure? After the procedure, it is common to have:  Numbness in the area where the needle was inserted (needle insertion site).  Bruising around the needle insertion site. This should go away after a few days.  Follow these instructions at home:  Take over-the-counter and prescription medicines only as told by your health care provider.  If the fluid in the cyst was sent for testing (biopsy), it is up to you to get your test results. Ask your health care provider, or the department performing the test, when your results will be ready.  If a bandage (dressing) was placed over your needle insertion site, keep it clean and dry. Remove it only as told by your health care provider. ? Wash your hands with soap and water before you change or remove your dressing. If soap and water are not available, use hand sanitizer.  Check your needle insertion site every day for signs of infection. Check for: ? Redness, swelling, or pain. ? Fluid or blood. ? Warmth. ? Pus or a bad smell.  Return to your normal activities as told by your health care provider. Ask your health care provider what activities are safe for you.  Keep all follow-up visits as told by your health care provider. This is important. You may need to have your cyst checked 4-6 weeks after your procedure to make sure the cyst has not filled up with fluid again. Contact a health care provider if:  You have redness, swelling, or pain around your needle insertion site.  You have fluid, blood, or cloudy discharge coming from your needle insertion site.  Your needle insertion site feels unusually warm to the touch.  You have pus or a bad smell coming  from your needle insertion site.  Your breast cyst changes in size or shape, or it comes back (recurs).  You have a fever. Get help right away if:  You develop chest pain or tightness.  You develop a rapid heart rate.  You have shortness of breath.  Your entire body feels very tired and weak.  Your skin turns a bluish color. This information is not intended to replace advice given to you by your health care provider. Make sure you discuss any questions you have with your health care provider. Document Released: 01/07/2013 Document Revised: 09/23/2015 Document Reviewed: 09/23/2015 Elsevier Interactive Patient Education  2017 [employer].

## 2016-09-04 NOTE — Progress Notes (Signed)
Patient left prior to being seen as she had a prior obligation and had waited 45 minutes at time of leaving.

## 2016-09-06 ENCOUNTER — Telehealth: Payer: Self-pay

## 2016-09-06 NOTE — Telephone Encounter (Signed)
Received further information from [facility] requesting further information regarding patient's time out of work and office notes.  Call made to patient at this time to get actual return to work date confirmed. No answer. Left voicemail for return phone call.  If patient returns call, I need to confirm return to work date prior to being able to fax final Disability paperwork.

## 2016-09-06 NOTE — Telephone Encounter (Signed)
Patient called - return to work date 08/28/16

## 2016-09-07 NOTE — Telephone Encounter (Signed)
Disability paperwork faxed to [facility] at this time with Return to work date of 08/28/16. Also sent office notes as requested. Patient will have a lifting restriction of no more than 15lbs until 09/16/16 per Dr. Aleen Campi.  Faxed to [phone] with Claim [id number] attached.  Positive confirmation received. Fax Job #: [phone]

## 2016-09-08 LAB — NUSWAB VAGINITIS PLUS (VG+)
Atopobium vaginae: HIGH Score — AB
BVAB 2: HIGH Score — AB
Candida albicans, NAA: NEGATIVE
Candida glabrata, NAA: NEGATIVE
Chlamydia trachomatis, NAA: POSITIVE — AB
Megasphaera 1: HIGH Score — AB
Neisseria gonorrhoeae, NAA: NEGATIVE
Trich vag by NAA: NEGATIVE

## 2016-09-09 LAB — ANAEROBIC AND AEROBIC CULTURE

## 2016-09-12 ENCOUNTER — Other Ambulatory Visit: Payer: Self-pay | Admitting: Certified Nurse Midwife

## 2016-09-12 ENCOUNTER — Encounter: Payer: Self-pay | Admitting: Certified Nurse Midwife

## 2016-09-12 MED ORDER — AZITHROMYCIN 500 MG PO TABS: 1000.0000 mg | ORAL_TABLET | Freq: Once | ORAL | 0 refills | Status: AC

## 2016-09-12 MED ORDER — METRONIDAZOLE 500 MG PO TABS: 500.0000 mg | ORAL_TABLET | Freq: Two times a day (BID) | ORAL | 0 refills | Status: AC

## 2016-09-12 NOTE — Progress Notes (Signed)
NuSwab positive for Chlamydia and BV. Pt notified via my chart.   Doreene Burke, CNM

## 2016-10-31 LAB — HIV ANTIBODY (ROUTINE TESTING W REFLEX): HIV Screen 4th Generation wRfx: NONREACTIVE

## 2017-02-12 ENCOUNTER — Telehealth: Payer: 59 | Admitting: Nurse Practitioner

## 2017-02-12 DIAGNOSIS — B9689 Other specified bacterial agents as the cause of diseases classified elsewhere: Secondary | ICD-10-CM

## 2017-02-12 DIAGNOSIS — N760 Acute vaginitis: Secondary | ICD-10-CM

## 2017-02-12 MED ORDER — METRONIDAZOLE 500 MG PO TABS: 500.0000 mg | ORAL_TABLET | Freq: Two times a day (BID) | ORAL | 0 refills | Status: DC

## 2017-02-12 NOTE — Progress Notes (Signed)

## 2017-02-13 ENCOUNTER — Other Ambulatory Visit: Payer: Self-pay

## 2017-02-13 ENCOUNTER — Emergency Department
Admission: EM | Admit: 2017-02-13 | Discharge: 2017-02-13 | Disposition: A | Discharge status: Home / Self Care | Payer: 59 | Attending: Emergency Medicine | Admitting: Emergency Medicine

## 2017-02-13 ENCOUNTER — Encounter: Payer: Self-pay | Admitting: Emergency Medicine

## 2017-02-13 DIAGNOSIS — K047 Periapical abscess without sinus: Secondary | ICD-10-CM | POA: Insufficient documentation

## 2017-02-13 DIAGNOSIS — K029 Dental caries, unspecified: Secondary | ICD-10-CM | POA: Diagnosis not present

## 2017-02-13 DIAGNOSIS — K0889 Other specified disorders of teeth and supporting structures: Secondary | ICD-10-CM | POA: Diagnosis present

## 2017-02-13 DIAGNOSIS — F17290 Nicotine dependence, other tobacco product, uncomplicated: Secondary | ICD-10-CM | POA: Insufficient documentation

## 2017-02-13 MED ORDER — LIDOCAINE-EPINEPHRINE 2 %-1:100000 IJ SOLN
1.7000 mL | Freq: Once | INTRAMUSCULAR | Status: AC
  Administered 2017-02-13: 1.7 mL
  Filled 2017-02-13: qty 1.7

## 2017-02-13 MED ORDER — OXYCODONE-ACETAMINOPHEN 5-325 MG PO TABS
1.0000 | ORAL_TABLET | Freq: Once | ORAL | Status: AC
  Administered 2017-02-13: 1 via ORAL
  Filled 2017-02-13: qty 1

## 2017-02-13 MED ORDER — AMOXICILLIN 875 MG PO TABS: 875.0000 mg | ORAL_TABLET | Freq: Two times a day (BID) | ORAL | 0 refills | Status: DC

## 2017-02-13 MED ORDER — IBUPROFEN 800 MG PO TABS
800.0000 mg | ORAL_TABLET | Freq: Once | ORAL | Status: AC
  Administered 2017-02-13: 800 mg via ORAL

## 2017-02-13 MED ORDER — MAGIC MOUTHWASH W/LIDOCAINE: 5.0000 mL | Freq: Four times a day (QID) | ORAL | 0 refills | Status: DC

## 2017-02-13 MED ORDER — TRAMADOL HCL 50 MG PO TABS: 50.0000 mg | ORAL_TABLET | Freq: Four times a day (QID) | ORAL | 0 refills | Status: DC | PRN

## 2017-02-13 MED ORDER — IBUPROFEN 800 MG PO TABS
ORAL_TABLET | ORAL | Status: AC
  Filled 2017-02-13: qty 1

## 2017-02-13 NOTE — ED Triage Notes (Signed)
Patient ambulatory to triage with steady gait, without difficulty or distress noted; pt reports left dental pain x 2 days

## 2017-02-13 NOTE — ED Provider Notes (Signed)
[facility] Emergency Department Provider Note  ____________________________________________  Time seen: Approximately 10:03 PM  I have reviewed the triage vital signs and the nursing notes.   HISTORY  Chief Complaint Dental Pain    HPI Alyssa Olson is a 27 y.o. female who presents to the emergency department with a complaint of left-sided dental pain.  Patient presented complaining of both upper and lower dental pain for several days.  Patient denies any dental fractures, gross swelling.  She denies any fevers or chills, difficulty breathing or swallowing.  Patient does not take any medication for this complaint.  She denies any other complaints at this time.  Past Medical History:  Diagnosis Date  . Acute appendicitis   . Bacterial vaginosis   . Bacterial vaginosis   . History of chlamydia     Patient Active Problem List   Diagnosis Date Noted  . Acute appendicitis 08/05/2016  . Hypertriglyceridemia 05/21/2015  . Chlamydia 04/13/2015  . Chronic midline low back pain without sciatica 01/01/2015  . Elevated antinuclear antibody (ANA) level 11/19/2014  . Elevated blood protein 11/11/2014  . Elevated sed rate 11/11/2014  . Acute pain of left knee 11/09/2014  . Bowel habit changes 11/09/2014  . Mild obesity 11/09/2014    Past Surgical History:  Procedure Laterality Date  . LAPAROSCOPIC APPENDECTOMY N/A 08/05/2016   Procedure: APPENDECTOMY LAPAROSCOPIC;  Surgeon: Henrene Dodge, MD;  Location: [facility] ORS;  Service: General;  Laterality: N/A;    Prior to Admission medications   Medication Sig Start Date End Date Taking? Authorizing Provider  amoxicillin (AMOXIL) 875 MG tablet Take 1 tablet (875 mg total) by mouth 2 (two) times daily. 02/13/17   Cuthriell, Delorise Royals, PA-C  magic mouthwash w/lidocaine SOLN Take 5 mLs by mouth 4 (four) times daily. 02/13/17   Cuthriell, Delorise Royals, PA-C  metroNIDAZOLE (FLAGYL) 500 MG tablet Take 1 tablet (500 mg  total) by mouth 2 (two) times daily. 02/12/17   Daphine Deutscher Mary-Margaret, FNP  traMADol (ULTRAM) 50 MG tablet Take 1 tablet (50 mg total) by mouth every 6 (six) hours as needed. 02/13/17   Cuthriell, Delorise Royals, PA-C    Allergies Patient has no known allergies.  Family History  Problem Relation Age of Onset  . Cancer Maternal Grandfather        colon  . Hypertension Maternal Grandfather     Social History Social History   Tobacco Use  . Smoking status: Current Every Day Smoker    Packs/day: 0.25    Types: Cigars  . Smokeless tobacco: Never Used  Substance Use Topics  . Alcohol use: No  . Drug use: No     Review of Systems  Constitutional: No fever/chills Eyes: No visual changes. No discharge ENT: Positive for left upper and left lower dental pain. Cardiovascular: no chest pain. Respiratory: no cough. No SOB. Gastrointestinal: No abdominal pain.  No nausea, no vomiting.  No diarrhea.  No constipation. Musculoskeletal: Negative for musculoskeletal pain. Skin: Negative for rash, abrasions, lacerations, ecchymosis. Neurological: Negative for headaches, focal weakness or numbness. 10-point ROS otherwise negative.  ____________________________________________   PHYSICAL EXAM:  VITAL SIGNS: ED Triage Vitals  Enc Vitals Group     BP 02/13/17 2006 137/86     Pulse Rate 02/13/17 2006 69     Resp 02/13/17 2006 18     Temp 02/13/17 2006 98.7 F (37.1 C)     Temp Source 02/13/17 2006 Oral     SpO2 02/13/17 2006 100 %  Weight 02/13/17 2004 151 lb (68.5 kg)     Height 02/13/17 2004 5' (1.524 m)     Head Circumference --      Peak Flow --      Pain Score 02/13/17 2004 10     Pain Loc --      Pain Edu? --      Excl. in GC? --      Constitutional: Alert and oriented. Well appearing and in no acute distress. Eyes: Conjunctivae are normal. PERRL. EOMI. Head: Atraumatic. ENT:      Ears:       Nose: No congestion/rhinnorhea.      Mouth/Throat: Mucous membranes are  moist.  Patient with multiple dental caries observed throughout the dentition.  No dental erosion into the pulp.  No missing dentition.  Patient does have some mild edema surrounding the first and second molar left upper dentition as well as erythema to the left first and second molar lower dentition.  No appreciable abscess.  No drainage.  Uvula is midline.  No appreciable edema noted to left jaw on external inspection. Neck: No stridor.   Hematological/Lymphatic/Immunilogical: No cervical lymphadenopathy. Cardiovascular: Normal rate, regular rhythm. Normal S1 and S2.  Good peripheral circulation. Respiratory: Normal respiratory effort without tachypnea or retractions. Lungs CTAB. Good air entry to the bases with no decreased or absent breath sounds. Musculoskeletal: Full range of motion to all extremities. No gross deformities appreciated. Neurologic:  Normal speech and language. No gross focal neurologic deficits are appreciated.  Skin:  Skin is warm, dry and intact. No rash noted. Psychiatric: Mood and affect are normal. Speech and behavior are normal. Patient exhibits appropriate insight and judgement.   ____________________________________________   LABS (all labs ordered are listed, but only abnormal results are displayed)  Labs Reviewed - No data to display ____________________________________________  EKG   ____________________________________________  RADIOLOGY   No results found.  ____________________________________________    PROCEDURES  Procedure(s) performed:    .Nerve Block Date/Time: 02/13/2017 10:40 PM Performed by: Racheal Patches, PA-C Authorized by: Racheal Patches, PA-C   Consent:    Consent obtained:  Verbal   Consent given by:  Patient   Risks discussed:  Allergic reaction Indications:    Indications:  Pain relief Location:    Body area:  Head   Head nerve blocked: Inferior alveolar nerve.   Laterality:  Left Procedure details  (see MAR for exact dosages):    Block needle gauge:  27 G   Anesthetic injected:  Lidocaine 1% WITH epi   Steroid injected:  None   Injection procedure:  Anatomic landmarks identified, introduced needle and negative aspiration for blood   Paresthesia:  None Post-procedure details:    Outcome:  Pain improved   Patient tolerance of procedure:  Tolerated well, no immediate complications       Medications  lidocaine-EPINEPHrine (XYLOCAINE W/EPI) 2 %-1:100000 (with pres) injection 1.7 mL (not administered)     ____________________________________________   INITIAL IMPRESSION / ASSESSMENT AND PLAN / ED COURSE  Pertinent labs & imaging results that were available during my care of the patient were reviewed by me and considered in my medical decision making (see chart for details).  Review of the [place] CSRS was performed in accordance of the [facility] prior to dispensing any controlled drugs.     Patient's diagnosis is consistent with dental infection and multiple dental caries to the dentition.  No appreciable abscess requiring incision and drainage.  No indication for labs or imaging.  Patient is given dental block for comfort.. Patient will be discharged home with prescriptions for #5 Ultram, amoxicillin, Magic mouthwash. Patient is to follow up with dentist as needed or otherwise directed. Patient is given ED precautions to return to the ED for any worsening or new symptoms.     ____________________________________________  FINAL CLINICAL IMPRESSION(S) / ED DIAGNOSES  Final diagnoses:  Dental abscess  Dental caries      NEW MEDICATIONS STARTED DURING THIS VISIT:  ED Discharge Orders        Ordered    amoxicillin (AMOXIL) 875 MG tablet  2 times daily     02/13/17 2227    magic mouthwash w/lidocaine SOLN  4 times daily    Comments:  Dispense in a 1/1/1/1 ratio. Use lidocaine, diphenhydramine, prednisolone, nystatin   02/13/17 2227    traMADol (ULTRAM) 50 MG tablet  Every 6  hours PRN     02/13/17 2227          This chart was dictated using voice recognition software/Dragon. Despite best efforts to proofread, errors can occur which can change the meaning. Any change was purely unintentional.    Racheal PatchesCuthriell, Jonathan D, PA-C 02/13/17 2243    Loleta RoseForbach, Cory, MD 02/14/17 [phone]

## 2017-02-13 NOTE — ED Notes (Signed)
Pt requesting stronger pain medication.  EDP states that if pt can have a ride here and in room in 30 minutes that he can give something stronger, but that without proof of ride pt cannot have anything stronger.  Pt attempting to find ride at this time.  Pt states she drove herself and pt understands that she cannot be discharged with anything stronger in her system without a responsible ride home.

## 2017-11-27 ENCOUNTER — Telehealth: Payer: 59 | Admitting: Family Medicine

## 2017-11-27 DIAGNOSIS — N941 Unspecified dyspareunia: Secondary | ICD-10-CM

## 2017-11-27 DIAGNOSIS — R350 Frequency of micturition: Secondary | ICD-10-CM

## 2017-11-27 DIAGNOSIS — N898 Other specified noninflammatory disorders of vagina: Secondary | ICD-10-CM

## 2017-11-27 NOTE — Progress Notes (Signed)
Attempted to call patient x 1 @ 1205 EST. No answer. My chart message sent as follow up response.

## 2017-11-27 NOTE — Progress Notes (Signed)
Amere, my concern is that because you have more than just vaginal discharge, low back pain, frequency, odor and pain on intercourse. It is important to be sure your are properly diagnosed the first time. I am hesitant to treat this without a physical exam. I hope you can understand I really feel that the best thing for you is to have a face to face visit where each symptom your reported can be adequately explored and assessed to ensure no harm comes to you by missing a symptom or diagnosis. I recommend your PCP, GYN or an urgent care that will perform a vaginal exam. I hope this helps.

## 2017-11-27 NOTE — Progress Notes (Signed)
Based on what you shared with me it looks like you have a condition that should be evaluated in a face to face office visit.  NOTE: If you entered your credit card information for this eVisit, you will not be charged. You may see a "hold" on your card for the $30 but that hold will drop off and you will not have a charge processed.  If you are having a true medical emergency please call 911.  If you need an urgent face to face visit, [facility] has four urgent care centers for your convenience.  If you need care fast and have a high deductible or no insurance consider:   [email or web address].[email or web address] to reserve your spot online an avoid wait times  [facility] [address], [address] [place], [place] [address] 8 am to 8 pm Monday-Friday 10 am to 4 pm Saturday-Sunday *Across the street from [employer]  [facility]  [address] [place], [address] 8 am to 5 pm Monday-Friday * In the [facility]   The following sites will take your  insurance:  . [facility]  [phone] Get Driving Directions Find a Provider at this Location  [address] [place], [place] [address] . 10 am to 8 pm Monday-Friday . 12 pm to 8 pm Saturday-Sunday   . [facility]  [phone] Get Driving Directions Find a Provider at this Location  [address], [address] , [place] [address] . 8 am to 8 pm Monday-Friday . 9 am to 6 pm Saturday . 11 am to 6 pm Sunday   . [facility] [place]  [phone] Get Driving Directions  [address] [place],  [address] . 8 am to 8 pm Monday-Friday . 8 am to 4 pm Saturday-Sunday   Your e-visit answers were reviewed by a board certified advanced clinical practitioner to complete your personal care plan.  Thank you for using e-Visits. 

## 2017-11-30 ENCOUNTER — Encounter: Payer: BC Managed Care – PPO | Admitting: Certified Nurse Midwife

## 2018-01-22 ENCOUNTER — Ambulatory Visit
Admission: EM | Admit: 2018-01-22 | Discharge: 2018-01-22 | Disposition: A | Discharge status: Home / Self Care | Payer: BC Managed Care – PPO | Attending: Family Medicine | Admitting: Family Medicine

## 2018-01-22 ENCOUNTER — Telehealth: Payer: 59 | Admitting: Physician Assistant

## 2018-01-22 ENCOUNTER — Other Ambulatory Visit: Payer: Self-pay

## 2018-01-22 ENCOUNTER — Encounter: Payer: Self-pay | Admitting: Emergency Medicine

## 2018-01-22 DIAGNOSIS — N898 Other specified noninflammatory disorders of vagina: Secondary | ICD-10-CM

## 2018-01-22 DIAGNOSIS — N760 Acute vaginitis: Secondary | ICD-10-CM

## 2018-01-22 DIAGNOSIS — A599 Trichomoniasis, unspecified: Secondary | ICD-10-CM | POA: Diagnosis not present

## 2018-01-22 DIAGNOSIS — B9689 Other specified bacterial agents as the cause of diseases classified elsewhere: Secondary | ICD-10-CM | POA: Diagnosis not present

## 2018-01-22 LAB — URINALYSIS, COMPLETE (UACMP) WITH MICROSCOPIC
Bilirubin Urine: NEGATIVE
Glucose, UA: NEGATIVE mg/dL
Ketones, ur: NEGATIVE mg/dL
Nitrite: NEGATIVE
Protein, ur: NEGATIVE mg/dL
Specific Gravity, Urine: 1.02 (ref 1.005–1.030)
pH: 6.5 (ref 5.0–8.0)

## 2018-01-22 LAB — WET PREP, GENITAL
Sperm: NONE SEEN
Yeast Wet Prep HPF POC: NONE SEEN

## 2018-01-22 MED ORDER — METRONIDAZOLE 500 MG PO TABS: 500.0000 mg | ORAL_TABLET | Freq: Two times a day (BID) | ORAL | 0 refills | Status: DC

## 2018-01-22 NOTE — Progress Notes (Signed)
Based on what you shared with me it looks like you have a serious condition that should be evaluated in a face to face office visit.  NOTE: If you entered your credit card information for this eVisit, you will not be charged. You may see a "hold" on your card for the $30 but that hold will drop off and you will not have a charge processed.  If you are having a true medical emergency please call 911.  If you need an urgent face to face visit, [facility] has four urgent care centers for your convenience.  If you need care fast and have a high deductible or no insurance consider:   [email or web address].[email or web address] to reserve your spot online an avoid wait times  [facility] [address], [address] [place], [place] [address] 8 am to 8 pm Monday-Friday 10 am to 4 pm Saturday-Sunday *Across the street from [employer]  [facility]  [address]  [place], [address] 8 am to 5 pm Monday-Friday * In the [facility]   The following sites will take your  insurance:  . [facility]  [phone] Get Driving Directions Find a Provider at this Location  [address] [place], [place] [address] . 10 am to 8 pm Monday-Friday . 12 pm to 8 pm Saturday-Sunday   . [facility]  [phone] Get Driving Directions Find a Provider at this Location  [address] [place], [place] [address] . 8 am to 8 pm Monday-Friday . 9 am to 6 pm Saturday . 11 am to 6 pm Sunday   .  [facility] [place]  [phone] Get Driving Directions  [address] [place], [place] [address] . 8 am to 8 pm Monday-Friday . 8 am to 4 pm Saturday-Sunday   Your e-visit answers were reviewed by a board certified advanced clinical practitioner to complete your personal care plan.  Thank you for using e-Visits.  

## 2018-01-22 NOTE — ED Triage Notes (Signed)
Patient in today c/o vaginal discharged and urinary frequency x 1 week. Patient does state that she has a new sexual partner and would like to be tested for STDs.

## 2018-01-22 NOTE — ED Provider Notes (Signed)
[facility]    CSN: [id number] Arrival date & time: 01/22/18  1814     History   Chief Complaint Chief Complaint  Patient presents with  . Personal Problem    APPT  . Urinary Frequency    HPI Alyssa Olson is a 28 y.o. female.   HPI  -year-old female presents with a vaginal discharge and urinary frequency that she has had for over a week.  With her new sexual partner for about a month.  Would like to be tested for STDs.  No discharge is not malodorous that she has noticed and it is white in color.        Past Medical History:  Diagnosis Date  . Acute appendicitis   . Bacterial vaginosis   . Bacterial vaginosis   . History of chlamydia     Patient Active Problem List   Diagnosis Date Noted  . Acute appendicitis 08/05/2016  . Hypertriglyceridemia 05/21/2015  . Chlamydia 04/13/2015  . Chronic midline low back pain without sciatica 01/01/2015  . Elevated antinuclear antibody (ANA) level 11/19/2014  . Elevated blood protein 11/11/2014  . Elevated sed rate 11/11/2014  . Acute pain of left knee 11/09/2014  . Bowel habit changes 11/09/2014  . Mild obesity 11/09/2014    Past Surgical History:  Procedure Laterality Date  . LAPAROSCOPIC APPENDECTOMY N/A 08/05/2016   Procedure: APPENDECTOMY LAPAROSCOPIC;  Surgeon: Henrene Dodge, MD;  Location: [facility] ORS;  Service: General;  Laterality: N/A;    OB History    Gravida  2   Para  0   Term  0   Preterm  0   AB  1   Living  1     SAB  0   TAB  1   Ectopic  0   Multiple      Live Births  1            Home Medications    Prior to Admission medications   Medication Sig Start Date End Date Taking? Authorizing Provider  metroNIDAZOLE (FLAGYL) 500 MG tablet Take 1 tablet (500 mg total) by mouth 2 (two) times daily. 01/22/18   Lutricia Feil, PA-C    Family History Family History  Problem Relation Age of Onset  . Healthy Mother   . Healthy Father   . Cancer Maternal  Grandfather        colon  . Hypertension Maternal Grandfather     Social History Social History   Tobacco Use  . Smoking status: Current Every Day Smoker    Packs/day: 0.25    Types: Cigars  . Smokeless tobacco: Never Used  Substance Use Topics  . Alcohol use: Yes    Comment: socially  . Drug use: No     Allergies   Patient has no known allergies.   Review of Systems Review of Systems  Constitutional: Negative for activity change, appetite change, chills, fatigue and fever.  Genitourinary: Positive for dysuria, frequency and vaginal discharge.  All other systems reviewed and are negative.    Physical Exam Triage Vital Signs ED Triage Vitals  Enc Vitals Group     BP 01/22/18 1850 (!) 117/96     Pulse Rate 01/22/18 1850 83     Resp 01/22/18 1850 16     Temp 01/22/18 1850 99.5 F (37.5 C)     Temp Source 01/22/18 1850 Oral     SpO2 01/22/18 1850 100 %     Weight 01/22/18 1851 150 lb (  68 kg)     Height 01/22/18 1851 5\' 1"  (1.549 m)     Head Circumference --      Peak Flow --      Pain Score 01/22/18 1850 0     Pain Loc --      Pain Edu? --      Excl. in GC? --    No data found.  Updated Vital Signs BP (!) 117/96 (BP Location: Left Arm)   Pulse 83   Temp 99.5 F (37.5 C) (Oral)   Resp 16   Ht 5\' 1"  (1.549 m)   Wt 150 lb (68 kg)   LMP 12/25/2017 (Approximate)   SpO2 100%   BMI 28.34 kg/m   Visual Acuity Right Eye Distance:   Left Eye Distance:   Bilateral Distance:    Right Eye Near:   Left Eye Near:    Bilateral Near:     Physical Exam Vitals signs and nursing note reviewed.  Constitutional:      General: She is not in acute distress.    Appearance: Normal appearance. She is not ill-appearing, toxic-appearing or diaphoretic.  HENT:     Head: Normocephalic.     Nose: Nose normal.     Mouth/Throat:     Mouth: Mucous membranes are moist.  Eyes:     General:        Right eye: No discharge.        Left eye: No discharge.      Conjunctiva/sclera: Conjunctivae normal.  Neck:     Musculoskeletal: Normal range of motion.  Genitourinary:    Comments: Patient performed a self swab Musculoskeletal: Normal range of motion.  Skin:    General: Skin is warm and dry.  Neurological:     General: No focal deficit present.     Mental Status: She is alert and oriented to person, place, and time.  Psychiatric:        Mood and Affect: Mood normal.        Behavior: Behavior normal.        Thought Content: Thought content normal.        Judgment: Judgment normal.      UC Treatments / Results  Labs (all labs ordered are listed, but only abnormal results are displayed) Labs Reviewed  WET PREP, GENITAL - Abnormal; Notable for the following components:      Result Value   Trich, Wet Prep PRESENT (*)    Clue Cells Wet Prep HPF POC PRESENT (*)    WBC, Wet Prep HPF POC FEW (*)    All other components within normal limits  URINALYSIS, COMPLETE (UACMP) WITH MICROSCOPIC - Abnormal; Notable for the following components:   APPearance HAZY (*)    Hgb urine dipstick TRACE (*)    Leukocytes, UA SMALL (*)    Bacteria, UA RARE (*)    Trichomonas, UA PRESENT (*)    All other components within normal limits  CHLAMYDIA/NGC RT PCR ([facility] ONLY)  URINE CULTURE    EKG None  Radiology No results found.  Procedures Procedures (including critical care time)  Medications Ordered in UC Medications - No data to display  Initial Impression / Assessment and Plan / UC Course  I have reviewed the triage vital signs and the nursing notes.  Pertinent labs & imaging results that were available during my care of the patient were reviewed by me and considered in my medical decision making (see chart for details).   Reviewed the laboratory  with the patient.  She has a normal appearing urine but I will culture it for completeness.  Trichomonas as well as BV.  Treat both with Flagyl twice daily for 7 days.  Told her not to drink  alcohol while on the medication and she should not have sexual intercourse for up to a week following the completion of the medications.  Will need to have her partner treated also.  Recommended following up in 3 months for proof of cure with another  vaginal swab.   Final Clinical Impressions(s) / UC Diagnoses   Final diagnoses:  BV (bacterial vaginosis)  Trichimoniasis     Discharge Instructions     Do not drink alcohol while taking the medication.  Read the enclosed instructions carefully and follow them.  Recommend following up in 3 months for another test to be certain of elimination of the trichomonas   ED Prescriptions    Medication Sig Dispense Auth. Provider   metroNIDAZOLE (FLAGYL) 500 MG tablet Take 1 tablet (500 mg total) by mouth 2 (two) times daily. 14 tablet Lutricia Feiloemer, Jaydence Vanyo P, PA-C     Controlled Substance Prescriptions [place] Controlled Substance Registry consulted? Not Applicable   Lutricia FeilRoemer, Misha Vanoverbeke P, PA-C 01/22/18 1954

## 2018-01-22 NOTE — Discharge Instructions (Addendum)
Do not drink alcohol while taking the medication.  Read the enclosed instructions carefully and follow them.  Recommend following up in 3 months for another test to be certain of elimination of the trichomonas

## 2018-01-23 ENCOUNTER — Telehealth (HOSPITAL_COMMUNITY): Payer: Self-pay | Admitting: Emergency Medicine

## 2018-01-23 LAB — CHLAMYDIA/NGC RT PCR (ARMC ONLY)
Chlamydia Tr: DETECTED — AB
N gonorrhoeae: NOT DETECTED

## 2018-01-23 MED ORDER — AZITHROMYCIN 250 MG PO TABS: 1000.0000 mg | ORAL_TABLET | Freq: Once | ORAL | 0 refills | Status: AC

## 2018-01-23 NOTE — Telephone Encounter (Signed)
Chlamydia is positive.  Rx po zithromax 1g #1 dose no refills was sent to the pharmacy of record.  Pt needs education to please refrain from sexual intercourse for 7 days to give the medicine time to work, sexual partners need to be notified and tested/treated.  Condoms may reduce risk of reinfection.  Recheck or followup with PCP for further evaluation if symptoms are not improving.   [facility] notified.  Patient contacted and made aware, all questions answered.

## 2018-01-24 LAB — URINE CULTURE: Culture: NO GROWTH

## 2018-05-01 ENCOUNTER — Telehealth: Payer: BC Managed Care – PPO | Admitting: Nurse Practitioner

## 2018-05-01 DIAGNOSIS — B373 Candidiasis of vulva and vagina: Secondary | ICD-10-CM | POA: Diagnosis not present

## 2018-05-01 DIAGNOSIS — B3731 Acute candidiasis of vulva and vagina: Secondary | ICD-10-CM

## 2018-05-01 MED ORDER — FLUCONAZOLE 150 MG PO TABS: 150.0000 mg | ORAL_TABLET | Freq: Once | ORAL | 0 refills | Status: AC

## 2018-05-01 NOTE — Progress Notes (Signed)
We are sorry that you are not feeling well. Here is how we plan to help! Based on what you shared with me it looks like you: May have a yeast vaginosis  Vaginosis is an inflammation of the vagina that can result in discharge, itching and pain. The cause is usually a change in the normal balance of vaginal bacteria or an infection. Vaginosis can also result from reduced estrogen levels after menopause.  The most common causes of vaginosis are:   Bacterial vaginosis which results from an overgrowth of one on several organisms that are normally present in your vagina.   Yeast infections which are caused by a naturally occurring fungus called candida.   Vaginal atrophy (atrophic vaginosis) which results from the thinning of the vagina from reduced estrogen levels after menopause.   Trichomoniasis which is caused by a parasite and is commonly transmitted by sexual intercourse.  Factors that increase your risk of developing vaginosis include: . Medications, such as antibiotics and steroids . Uncontrolled diabetes . Use of hygiene products such as bubble bath, vaginal spray or vaginal deodorant . Douching . Wearing damp or tight-fitting clothing . Using an intrauterine device (IUD) for birth control . Hormonal changes, such as those associated with pregnancy, birth control pills or menopause . Sexual activity . Having a sexually transmitted infection  Your treatment plan is A single Diflucan (fluconazole) 150mg tablet once.  I have electronically sent this prescription into the pharmacy that you have chosen.  Be sure to take all of the medication as directed. Stop taking any medication if you develop a rash, tongue swelling or shortness of breath. Mothers who are breast feeding should consider pumping and discarding their breast milk while on these antibiotics. However, there is no consensus that infant exposure at these doses would be harmful.  Remember that medication creams can weaken latex  condoms. .   HOME CARE:  Good hygiene may prevent some types of vaginosis from recurring and may relieve some symptoms:  . Avoid baths, hot tubs and whirlpool spas. Rinse soap from your outer genital area after a shower, and dry the area well to prevent irritation. Don't use scented or harsh soaps, such as those with deodorant or antibacterial action. . Avoid irritants. These include scented tampons and pads. . Wipe from front to back after using the toilet. Doing so avoids spreading fecal bacteria to your vagina.  Other things that may help prevent vaginosis include:  . Don't douche. Your vagina doesn't require cleansing other than normal bathing. Repetitive douching disrupts the normal organisms that reside in the vagina and can actually increase your risk of vaginal infection. Douching won't clear up a vaginal infection. . Use a latex condom. Both female and female latex condoms may help you avoid infections spread by sexual contact. . Wear cotton underwear. Also wear pantyhose with a cotton crotch. If you feel comfortable without it, skip wearing underwear to bed. Yeast thrives in moist environments Your symptoms should improve in the next day or two.  GET HELP RIGHT AWAY IF:  . You have pain in your lower abdomen ( pelvic area or over your ovaries) . You develop nausea or vomiting . You develop a fever . Your discharge changes or worsens . You have persistent pain with intercourse . You develop shortness of breath, a rapid pulse, or you faint.  These symptoms could be signs of problems or infections that need to be evaluated by a medical provider now.  MAKE SURE YOU      Understand these instructions.  Will watch your condition.  Will get help right away if you are not doing well or get worse.  Your e-visit answers were reviewed by a board certified advanced clinical practitioner to complete your personal care plan. Depending upon the condition, your plan could have included  both over the counter or prescription medications. Please review your pharmacy choice to make sure that you have choses a pharmacy that is open for you to pick up any needed prescription, Your safety is important to us. If you have drug allergies check your prescription carefully.   You can use MyChart to ask questions about today's visit, request a non-urgent call back, or ask for a work or school excuse for 24 hours related to this e-Visit. If it has been greater than 24 hours you will need to follow up with your provider, or enter a new e-Visit to address those concerns. You will get a MyChart message within the next two days asking about your experience. I hope that your e-visit has been valuable and will speed your recovery.  5 minutes spent reviewing and documenting in chart.  

## 2018-06-17 ENCOUNTER — Encounter: Payer: Self-pay | Admitting: Certified Nurse Midwife

## 2018-06-17 ENCOUNTER — Ambulatory Visit: Payer: BC Managed Care – PPO | Admitting: Certified Nurse Midwife

## 2018-06-17 ENCOUNTER — Other Ambulatory Visit: Payer: Self-pay

## 2018-06-17 VITALS — BP 114/78 | HR 83 | Ht 61.0 in | Wt 149.2 lb

## 2018-06-17 DIAGNOSIS — N83209 Unspecified ovarian cyst, unspecified side: Secondary | ICD-10-CM | POA: Insufficient documentation

## 2018-06-17 DIAGNOSIS — N920 Excessive and frequent menstruation with regular cycle: Secondary | ICD-10-CM | POA: Diagnosis not present

## 2018-06-17 DIAGNOSIS — N83201 Unspecified ovarian cyst, right side: Secondary | ICD-10-CM

## 2018-06-17 DIAGNOSIS — N83202 Unspecified ovarian cyst, left side: Secondary | ICD-10-CM | POA: Diagnosis not present

## 2018-06-17 NOTE — Progress Notes (Signed)
GYN ENCOUNTER NOTE  Subjective:       Alyssa Olson is a 28 y.o. G34P1011 female is here for gynecologic evaluation of the following issues:  1. Follow up. She was seen on 06/15/18 for pelvic pain and found to have bilateral ovarian cysts. She is managing the pain with motrin. It is working well for her.    Gynecologic History Patient's last menstrual period was 06/14/2018 (exact date). Contraception: none Last Pap: 03/2015. Results were: normal Last mammogram: n/a.   Obstetric History OB History  Gravida Para Term Preterm AB Living  _0 0 1 1  SAB TAB Ectopic Multiple Live Births  0 1 0   1    # Outcome Date GA Lbr Len/2nd Weight Sex Delivery Anes PTL Lv  2 TAB 02/15/14 [redacted]w[redacted]d        1 Term 10/23/13   7 lb 10 oz (3.459 kg) M Vag-Spont  N LIV    Past Medical History:  Diagnosis Date  . Acute appendicitis   . Bacterial vaginosis   . Bacterial vaginosis   . History of chlamydia     Past Surgical History:  Procedure Laterality Date  . LAPAROSCOPIC APPENDECTOMY N/A 08/05/2016   Procedure: APPENDECTOMY LAPAROSCOPIC;  Surgeon: POlean Ree MD;  Location: [facility] ORS;  Service: General;  Laterality: N/A;    Current Outpatient Medications on File Prior to Visit  Medication Sig Dispense Refill  . metroNIDAZOLE (FLAGYL) 500 MG tablet Take by mouth.     No current facility-administered medications on file prior to visit.     No Known Allergies  Social History   Socioeconomic History  . Marital status: Single    Spouse name: Not on file  . Number of children: Not on file  . Years of education: Not on file  . Highest education level: Not on file  Occupational History  . Not on file  Social Needs  . Financial resource strain: Not on file  . Food insecurity:    Worry: Not on file    Inability: Not on file  . Transportation needs:    Medical: Not on file    Non-medical: Not on file  Tobacco Use  . Smoking status: Current Every Day Smoker    Packs/day: 0.25     Types: Cigars  . Smokeless tobacco: Never Used  Substance and Sexual Activity  . Alcohol use: Yes    Comment: socially  . Drug use: No  . Sexual activity: Yes    Birth control/protection: None  Lifestyle  . Physical activity:    Days per week: Not on file    Minutes per session: Not on file  . Stress: Not on file  Relationships  . Social connections:    Talks on phone: Not on file    Gets together: Not on file    Attends religious service: Not on file    Active member of club or organization: Not on file    Attends meetings of clubs or organizations: Not on file    Relationship status: Not on file  . Intimate partner violence:    Fear of current or ex partner: Not on file    Emotionally abused: Not on file    Physically abused: Not on file    Forced sexual activity: Not on file  Other Topics Concern  . Not on file  Social History Narrative   ** Merged History Encounter **        Family History  Problem  Relation Age of Onset  . Healthy Mother   . Healthy Father   . Cancer Maternal Grandfather        colon  . Hypertension Maternal Grandfather     The following portions of the patient's history were reviewed and updated as appropriate: allergies, current medications, past family history, past medical history, past social history, past surgical history and problem list.  Review of Systems Review of Systems - Negative except as mentioned in HPI Review of Systems - General ROS: negative for - chills, fatigue, fever, hot flashes, malaise or night sweats Hematological and Lymphatic ROS: negative for - bleeding problems or swollen lymph nodes Gastrointestinal ROS: negative for - abdominal pain, blood in stools, change in bowel habits and nausea/vomiting Musculoskeletal ROS: negative for - joint pain, muscle pain or muscular weakness Genito-Urinary ROS: negative for - change in menstrual cycle, dysmenorrhea, dyspareunia, dysuria, genital discharge, genital ulcers, hematuria,  incontinence, irregular/heavy menses, nocturia. Positive for pelvic pain.   Objective:   BP 114/78   Pulse 83   Ht _0  (1.549 m)   Wt 149 lb 3 oz (67.7 kg)   LMP 06/14/2018 (Exact Date)   BMI 28.19 kg/m  CONSTITUTIONAL: Well-developed, well-nourished female in no acute distress.  HENT:  Normocephalic, atraumatic.  NECK: Normal range of motion, supple, no masses.  Normal thyroid.  SKIN: Skin is warm and dry. No rash noted. Not diaphoretic. No erythema. No pallor. [place]: Alert and oriented to person, place, and time. PSYCHIATRIC: Normal mood and affect. Normal behavior. Normal judgment and thought content. CARDIOVASCULAR:Not Examined RESPIRATORY: Not Examined BREASTS: Not Examined ABDOMEN: Soft, non distended; Non tender.  No Organomegaly. PELVIC:not indicated, pt declines stating she had at ED.  MUSCULOSKELETAL: Normal range of motion. No tenderness.  No cyanosis, clubbing, or edema.   Assessment:   Bilateral Ovarian Cysts.     Plan:   Follow up u/s 3 months for ovarian cysts. Discussed treatment options with use of birth control ; Pt declines stating she is trying to conceive for over 1 yr. Reviewed red flag symptoms and when to go to ED for ovarian cyst.   She has a child with her current partner from 76 and had no difficulties conceiving.  Labs today TSH. Discussed use of app to tack ovulation as well as use of ovulation kit. Information given on semen analysis for her partner. Discussed healthy lifestyles. Pt encouraged to follow up in 2-3 months after trying ovulation kit and having semen analysis completed to discuss clomid use. Patient verbalizes and agrees to plan of care.   I attest more than 50% of visit spent reviewing history, discussing cysts, discussing treatment options, and discussing conception/infertility treatment. Face to face time 15 min.   Philip Aspen, CNM

## 2018-06-17 NOTE — Patient Instructions (Signed)
Female Infertility  Female infertility refers to a woman's inability to get pregnant (conceive) after a year of having sex regularly (or after 6 months in women over age 27) without using birth control. Infertility can also mean that a woman is not able to carry a pregnancy to full term. Both women and men can have fertility problems. What are the causes? This condition may be caused by:  Problems with reproductive organs. Infertility can result if a woman: ? Has an abnormally short cervix or a cervix that does not remain closed during a pregnancy. ? Has a blockage or scarring in the fallopian tubes. ? Has an abnormally shaped uterus. ? Has uterine fibroids. This is a benign mass of tissue or muscle (tumor) that can develop in the uterus. ? Is not ovulating in a regular way.  Certain medical conditions. These may include: ? Polycystic ovary syndrome (PCOS). This is a hormonal disorder that can cause small cysts to grow on the ovaries. This is the most common cause of infertility in women. ? Endometriosis. This is a condition in which the tissue that lines the uterus (endometrium) grows outside of its normal location. ? Cancer and cancer treatments, such as chemotherapy or radiation. ? Premature ovarian failure. This is when ovaries stop producing eggs and hormones before age 43. ? Sexually transmitted diseases, such as chlamydia or gonorrhea. ? Autoimmune disorders. These are disorders in which the body's defense system (immune system) attacks normal, healthy cells. Infertility can be linked to more than one cause. For some women, the cause of infertility is not known (unexplained infertility). What increases the risk?  Age. A woman's fertility declines with age, especially after her mid-62s.  Being underweight or overweight.  Drinking too much alcohol.  Using drugs such as anabolic steroids, cocaine, and marijuana.  Exercising excessively.  Being exposed to environmental toxins,  such as radiation, pesticides, and certain chemicals. What are the signs or symptoms? The main sign of infertility in women is the inability to get pregnant or carry a pregnancy to full term. How is this diagnosed? This condition may be diagnosed by:  Checking whether you are ovulating each month. The tests may include: ? Blood tests to check hormone levels. ? An ultrasound of the ovaries. ? Taking a small tissue that lines the uterus and checking it under a microscope (endometrial biopsy).  Doing additional tests. This is done if ovulation is normal. Tests may include: ? Hysterosalpingography. This X-ray test can show the shape of the uterus and whether the fallopian tubes are open. ? Laparoscopy. This test uses a lighted tube (laparoscope) to look for problems in the fallopian tubes and other organs. ? Transvaginal ultrasound. This imaging test is used to check for abnormalities in the uterus and ovaries. ? Hysteroscopy. This test uses a lighted tube to check for problems in the cervix and the uterus. To be diagnosed with infertility, both partners will have a physical exam. Both partners will also have an extensive medical and sexual history taken. Additional tests may be done. How is this treated? Treatment depends on the cause of infertility. Most cases of infertility in women are treated with medicine or surgery.  Women may take medicine to: ? Correct ovulation problems. ? Treat other health conditions.  Surgery may be done to: ? Repair damage to the ovaries, fallopian tubes, cervix, or uterus. ? Remove growths from the uterus. ? Remove scar tissue from the uterus, pelvis, or other organs. Assisted reproductive technology (ART) Assisted reproductive technology (  ART) refers to all treatments and procedures that combine eggs and sperm outside the body to try to help a couple conceive. ART is often combined with fertility drugs to stimulate ovulation. Sometimes ART is done using eggs  retrieved from another woman's body (donor eggs) or from previously frozen fertilized eggs (embryos). There are different types of ART. These include:  Intrauterine insemination (IUI). A long, thin tube is used to place sperm directly into a woman's uterus. This procedure: ? Is effective for infertility caused by sperm problems, including low sperm count and low motility. ? Can be used in combination with fertility drugs.  In vitro fertilization (IVF). This is done when a woman's fallopian tubes are blocked or when a man has low sperm count. In this procedure: ? Fertility drugs are used to stimulate the ovaries to produce multiple eggs. ? Once mature, these eggs are removed from the body and combined with the sperm to be fertilized. ? The fertilized eggs are then placed into the woman's uterus. Follow these instructions at home:  Take over-the-counter and prescription medicines only as told by your health care provider.  Do not use any products that contain nicotine or tobacco, such as cigarettes and e-cigarettes. If you need help quitting, ask your health care provider.  If you drink alcohol, limit how much you have to 1 drink a day.  Make dietary changes to lose weight or maintain a healthy weight. Work with your health care provider and a dietitian to set a weight-loss goal that is healthy and reasonable for you.  Seek support from a counselor or support group to talk about your concerns related to infertility. Couples counseling may be helpful for you and your partner.  Practice stress reduction techniques that work well for you, such as regular physical activity, meditation, or deep breathing.  Keep all follow-up visits as told by your health care provider. This is important. Contact a health care provider if you:  Feel that stress is interfering with your life and relationships.  Have side effects from treatments for infertility. Summary  Female infertility refers to a woman's  inability to get pregnant (conceive) after a year of having sex regularly (or after 6 months in women over age 36) without using birth control.  To be diagnosed with infertility, both partners will have a physical exam. Both partners will also have an extensive medical and sexual history taken.  Seek support from a counselor or support group to talk about your concerns related to infertility. Couples counseling may be helpful for you and your partner. This information is not intended to replace advice given to you by your health care provider. Make sure you discuss any questions you have with your health care provider. Document Released: 01/05/2003 Document Revised: 12/04/2016 Document Reviewed: 12/04/2016 Elsevier Interactive Patient Education  2019 [employer] Ovarian Cyst     An ovarian cyst is a fluid-filled sac that forms on an ovary. The ovaries are small organs that produce eggs in women. Various types of cysts can form on the ovaries. Some may cause symptoms and require treatment. Most ovarian cysts go away on their own, are not cancerous (are benign), and do not cause problems. Common types of ovarian cysts include:  Functional (follicle) cysts. ? Occur during the menstrual cycle, and usually go away with the next menstrual cycle if you do not get pregnant. ? Usually cause no symptoms.  Endometriomas. ? Are cysts that form from the tissue that lines the uterus (endometrium). ?  Are sometimes called "chocolate cysts" because they become filled with blood that turns brown. ? Can cause pain in the lower abdomen during intercourse and during your period.  Cystadenoma cysts. ? Develop from cells on the outside surface of the ovary. ? Can get very large and cause lower abdomen pain and pain with intercourse. ? Can cause severe pain if they twist or break open (rupture).  Dermoid cysts. ? Are sometimes found in both ovaries. ? May contain different kinds of body tissue, such as  skin, teeth, hair, or cartilage. ? Usually do not cause symptoms unless they get very big.  Theca lutein cysts. ? Occur when too much of a certain hormone (human chorionic gonadotropin) is produced and overstimulates the ovaries to produce an egg. ? Are most common after having procedures used to assist with the conception of a baby (in vitro fertilization). What are the causes? Ovarian cysts may be caused by:  Ovarian hyperstimulation syndrome. This is a condition that can develop from taking fertility medicines. It causes multiple large ovarian cysts to form.  Polycystic ovarian syndrome (PCOS). This is a common hormonal disorder that can cause ovarian cysts, as well as problems with your period or fertility. What increases the risk? The following factors may make you more likely to develop ovarian cysts:  Being overweight or obese.  Taking fertility medicines.  Taking certain forms of hormonal birth control.  Smoking. What are the signs or symptoms? Many ovarian cysts do not cause symptoms. If symptoms are present, they may include:  Pelvic pain or pressure.  Pain in the lower abdomen.  Pain during sex.  Abdominal swelling.  Abnormal menstrual periods.  Increasing pain with menstrual periods. How is this diagnosed? These cysts are commonly found during a routine pelvic exam. You may have tests to find out more about the cyst, such as:  Ultrasound.  X-ray of the pelvis.  CT scan.  MRI.  Blood tests. How is this treated? Many ovarian cysts go away on their own without treatment. Your health care provider may want to check your cyst regularly for 2-3 months to see if it changes. If you are in menopause, it is especially important to have your cyst monitored closely because menopausal women have a higher rate of ovarian cancer. When treatment is needed, it may include:  Medicines to help relieve pain.  A procedure to drain the cyst (aspiration).  Surgery to  remove the whole cyst.  Hormone treatment or birth control pills. These methods are sometimes used to help dissolve a cyst. Follow these instructions at home:  Take over-the-counter and prescription medicines only as told by your health care provider.  Do not drive or use heavy machinery while taking prescription pain medicine.  Get regular pelvic exams and Pap tests as often as told by your health care provider.  Return to your normal activities as told by your health care provider. Ask your health care provider what activities are safe for you.  Do not use any products that contain nicotine or tobacco, such as cigarettes and e-cigarettes. If you need help quitting, ask your health care provider.  Keep all follow-up visits as told by your health care provider. This is important. Contact a health care provider if:  Your periods are late, irregular, or painful, or they stop.  You have pelvic pain that does not go away.  You have pressure on your bladder or trouble emptying your bladder completely.  You have pain during sex.  You have  any of the following in your abdomen: ? A feeling of fullness. ? Pressure. ? Discomfort. ? Pain that does not go away. ? Swelling.  You feel generally ill.  You become constipated.  You lose your appetite.  You develop severe acne.  You start to have more body hair and facial hair.  You are gaining weight or losing weight without changing your exercise and eating habits.  You think you may be pregnant. Get help right away if:  You have abdominal pain that is severe or gets worse.  You cannot eat or drink without vomiting.  You suddenly develop a fever.  Your menstrual period is much heavier than usual. This information is not intended to replace advice given to you by your health care provider. Make sure you discuss any questions you have with your health care provider. Document Released: 01/02/2005 Document Revised: 07/23/2015  Document Reviewed: 06/06/2015 Elsevier Interactive Patient Education  2019 [employer].

## 2018-06-18 LAB — THYROID PANEL WITH TSH
Free Thyroxine Index: 2 (ref 1.2–4.9)
T3 Uptake Ratio: 30 % (ref 24–39)
T4, Total: 6.7 ug/dL (ref 4.5–12.0)
TSH: 4.13 u[IU]/mL (ref 0.450–4.500)

## 2018-09-17 ENCOUNTER — Encounter: Payer: BC Managed Care – PPO | Admitting: Certified Nurse Midwife

## 2020-12-04 ENCOUNTER — Other Ambulatory Visit: Payer: Self-pay

## 2020-12-04 ENCOUNTER — Emergency Department
Admission: EM | Admit: 2020-12-04 | Discharge: 2020-12-04 | Disposition: A | Discharge status: Home / Self Care | Payer: No Typology Code available for payment source | Attending: Emergency Medicine | Admitting: Emergency Medicine

## 2020-12-04 ENCOUNTER — Encounter: Payer: Self-pay | Admitting: Emergency Medicine

## 2020-12-04 ENCOUNTER — Emergency Department: Payer: No Typology Code available for payment source

## 2020-12-04 DIAGNOSIS — W010XXA Fall on same level from slipping, tripping and stumbling without subsequent striking against object, initial encounter: Secondary | ICD-10-CM | POA: Insufficient documentation

## 2020-12-04 DIAGNOSIS — S99912A Unspecified injury of left ankle, initial encounter: Secondary | ICD-10-CM | POA: Diagnosis present

## 2020-12-04 DIAGNOSIS — Y9289 Other specified places as the place of occurrence of the external cause: Secondary | ICD-10-CM | POA: Diagnosis not present

## 2020-12-04 DIAGNOSIS — F17210 Nicotine dependence, cigarettes, uncomplicated: Secondary | ICD-10-CM | POA: Diagnosis not present

## 2020-12-04 DIAGNOSIS — S93402A Sprain of unspecified ligament of left ankle, initial encounter: Secondary | ICD-10-CM | POA: Diagnosis not present

## 2020-12-04 MED ORDER — IBUPROFEN 800 MG PO TABS
800.0000 mg | ORAL_TABLET | Freq: Once | ORAL | Status: AC
  Administered 2020-12-04: 800 mg via ORAL
  Filled 2020-12-04: qty 1

## 2020-12-04 MED ORDER — IBUPROFEN 800 MG PO TABS: 800.0000 mg | ORAL_TABLET | Freq: Three times a day (TID) | ORAL | 0 refills | Status: DC | PRN

## 2020-12-04 NOTE — Discharge Instructions (Addendum)
You may alternate Tylenol 1000 mg every 6 hours as needed for pain, fever and Ibuprofen 800 mg every 8 hours as needed for pain, fever.  Please take Ibuprofen with food.  Do not take more than 4000 mg of Tylenol (acetaminophen) in a 24 hour period.  

## 2020-12-04 NOTE — ED Provider Notes (Signed)
[facility] Emergency Department Provider Note ____________________________________________   Event Date/Time   First MD Initiated Contact with Patient 12/04/20 [phone]     (approximate)  I have reviewed the triage vital signs and the nursing notes.   HISTORY  Chief Complaint Ankle Pain    HPI Alyssa Olson is a 30 y.o. female with no significant past medical history who presents to the emergency department with a left ankle injury.  States she twisted her ankle just prior to arrival.  She denies falling to the ground or hitting her head.  Left ankle is painful and swollen.         Past Medical History:  Diagnosis Date   Acute appendicitis    Bacterial vaginosis    Bacterial vaginosis    History of chlamydia     Patient Active Problem List   Diagnosis Date Noted   Ovarian cyst 06/17/2018   Hypertriglyceridemia 05/21/2015   Chronic midline low back pain without sciatica 01/01/2015   Elevated antinuclear antibody (ANA) level 11/19/2014   Elevated blood protein 11/11/2014   Elevated sed rate 11/11/2014   Acute pain of left knee 11/09/2014   Bowel habit changes 11/09/2014   Mild obesity 11/09/2014    Past Surgical History:  Procedure Laterality Date   LAPAROSCOPIC APPENDECTOMY N/A 08/05/2016   Procedure: APPENDECTOMY LAPAROSCOPIC;  Surgeon: Henrene Dodge, MD;  Location: [facility] ORS;  Service: General;  Laterality: N/A;    Prior to Admission medications   Medication Sig Start Date End Date Taking? Authorizing Provider  ibuprofen (ADVIL) 800 MG tablet Take 1 tablet (800 mg total) by mouth every 8 (eight) hours as needed for mild pain. 12/04/20  Yes Jeymi Hepp, Layla Maw, DO    Allergies Patient has no known allergies.  Family History  Problem Relation Age of Onset   Healthy Mother    Healthy Father    Cancer Maternal Grandfather        colon   Hypertension Maternal Grandfather     Social History Social History   Tobacco Use    Smoking status: Every Day    Packs/day: 0.25    Types: Cigars, Cigarettes   Smokeless tobacco: Never  Vaping Use   Vaping Use: Never used  Substance Use Topics   Alcohol use: Yes    Comment: socially   Drug use: No    Review of Systems Constitutional: No fever. Eyes: No visual changes. ENT: No sore throat. Cardiovascular: Denies chest pain. Respiratory: Denies shortness of breath. Gastrointestinal: No nausea, vomiting, diarrhea. Genitourinary: Negative for dysuria. Musculoskeletal: Negative for back pain. Skin: Negative for rash. Neurological: Negative for focal weakness or numbness.   ____________________________________________   PHYSICAL EXAM:  VITAL SIGNS: ED Triage Vitals  Enc Vitals Group     BP 12/04/20 0418 121/79     Pulse Rate 12/04/20 0418 (!) 128     Resp 12/04/20 0418 18     Temp 12/04/20 0418 98.9 F (37.2 C)     Temp Source 12/04/20 0418 Oral     SpO2 12/04/20 0418 99 %     Weight 12/04/20 0420 168 lb (76.2 kg)     Height --      Head Circumference --      Peak Flow --      Pain Score --      Pain Loc --      Pain Edu? --      Excl. in GC? --    CONSTITUTIONAL: Alert and oriented  and responds appropriately to questions. Well-appearing; well-nourished; GCS 15 HEAD: Normocephalic; atraumatic EYES: Conjunctivae clear, PERRL, EOMI ENT: normal nose; no rhinorrhea; moist mucous membranes; pharynx without lesions noted; no dental injury; no septal hematoma NECK: Supple, no meningismus, no LAD; no midline spinal tenderness, step-off or deformity; trachea midline CARD: Regular and tachycardic; S1 and S2 appreciated; no murmurs, no clicks, no rubs, no gallops RESP: Normal chest excursion without splinting or tachypnea; breath sounds clear and equal bilaterally; no wheezes, no rhonchi, no rales; no hypoxia or respiratory distress CHEST:  chest wall stable, no crepitus or ecchymosis or deformity, nontender to palpation; no flail chest ABD/GI: Normal bowel  sounds; non-distended; soft, non-tender, no rebound, no guarding; no ecchymosis or other lesions noted PELVIS:  stable, nontender to palpation BACK:  The back appears normal and is non-tender to palpation, there is no CVA tenderness; no midline spinal tenderness, step-off or deformity EXT: Patient is tender to palpation diffusely over the left ankle with soft tissue swelling.  No redness.  Difficult to test for ligamentous laxity due to patient's discomfort.  No tenderness over the left fibular head.  No tenderness over the left foot.  2+ left DP pulse.  No calf tenderness or calf swelling.  Compartments soft.  Otherwise extremities nontender to palpation. SKIN: Normal color for age and race; warm NEURO: Moves all extremities equally PSYCH: The patient's mood and manner are appropriate. Grooming and personal hygiene are appropriate.  ____________________________________________   LABS (all labs ordered are listed, but only abnormal results are displayed)  Labs Reviewed - No data to display ____________________________________________  EKG   ____________________________________________  RADIOLOGY I, Jenson Beedle, personally viewed and evaluated these images (plain radiographs) as part of my medical decision making, as well as reviewing the written report by the radiologist.  ED MD interpretation: Left ankle x-ray shows no fracture or dislocation.  Official radiology report(s): DG Ankle Complete Left  Result Date: 12/04/2020 CLINICAL DATA:  30 year old female status post fall. Left ankle swelling, unable to weightbear. EXAM: LEFT ANKLE COMPLETE - 3+ VIEW COMPARISON:  None. FINDINGS: Bone mineralization is within normal limits. Mortise joint alignment preserved. Talar dome intact. Possible joint effusion on the lateral. And moderate to severe anterior and lateral soft tissue swelling and stranding. But the distal tibia, fibula, calcaneus, and other visible bones of the left foot appear  intact. IMPRESSION: Anterior and lateral soft tissue swelling plus evidence of joint effusion. But no acute fracture or dislocation identified about the left ankle. Electronically Signed   By: Odessa Fleming M.D.   On: 12/04/2020 05:20    ____________________________________________   PROCEDURES  Procedure(s) performed (including Critical Care):  Procedures   ____________________________________________   INITIAL IMPRESSION / ASSESSMENT AND PLAN / ED COURSE  As part of my medical decision making, I reviewed the following data within the electronic MEDICAL RECORD NUMBER Nursing notes reviewed and incorporated, Old chart reviewed, Radiograph reviewed , and Notes from prior ED visits         Patient here with a left ankle sprain.  Neurovascular intact distally.  No other sign of injury on exam.  We will place an Ace wrap and provide with crutches for comfort.  Have offered her something for pain and she requests ibuprofen only.  She declines a prescription for any narcotic pain medication.  Given outpatient orthopedic follow-up if pain and swelling or not improving in 1 week with conservative management.  She feels comfortable with plan for discharge home.  At this time, I  do not feel there is any life-threatening condition present. I have reviewed, interpreted and discussed all results (EKG, imaging, lab, urine as appropriate) and exam findings with patient/family. I have reviewed nursing notes and appropriate previous records.  I feel the patient is safe to be discharged home without further emergent workup and can continue workup as an outpatient as needed. Discussed usual and customary return precautions. Patient/family verbalize understanding and are comfortable with this plan.  Outpatient follow-up has been provided as needed. All questions have been answered.   ____________________________________________   FINAL CLINICAL IMPRESSION(S) / ED DIAGNOSES  Final diagnoses:  Sprain of left ankle,  unspecified ligament, initial encounter     ED Discharge Orders          Ordered    ibuprofen (ADVIL) 800 MG tablet  Every 8 hours PRN        12/04/20 1660            *Please note:  Veola L Olson was evaluated in Emergency Department on 12/04/2020 for the symptoms described in the history of present illness. She was evaluated in the context of the global COVID-19 pandemic, which necessitated consideration that the patient might be at risk for infection with the SARS-CoV-2 virus that causes COVID-19. Institutional protocols and algorithms that pertain to the evaluation of patients at risk for COVID-19 are in a state of rapid change based on information released by regulatory bodies including the CDC and federal and state organizations. These policies and algorithms were followed during the patient's care in the ED.  Some ED evaluations and interventions may be delayed as a result of limited staffing during and the pandemic.*   Note:  This document was prepared using Dragon voice recognition software and may include unintentional dictation errors.    Shontae Rosiles, Layla Maw, DO 12/04/20 [id number]

## 2020-12-04 NOTE — ED Triage Notes (Signed)
Pt in after trip fall, injuring L outer ankle. Unable to bear weight on LLE. Swollen and limited ROM

## 2020-12-15 ENCOUNTER — Ambulatory Visit: Payer: No Typology Code available for payment source

## 2020-12-23 ENCOUNTER — Telehealth: Payer: No Typology Code available for payment source | Admitting: Physician Assistant

## 2020-12-23 DIAGNOSIS — L309 Dermatitis, unspecified: Secondary | ICD-10-CM | POA: Diagnosis not present

## 2020-12-23 DIAGNOSIS — L409 Psoriasis, unspecified: Secondary | ICD-10-CM

## 2020-12-23 MED ORDER — FLUOCINOLONE ACETONIDE 0.01 % EX SOLN: Freq: Two times a day (BID) | CUTANEOUS | 0 refills | Status: DC

## 2020-12-23 MED ORDER — TRIAMCINOLONE ACETONIDE 0.1 % EX CREA: 1.0000 "application " | TOPICAL_CREAM | Freq: Two times a day (BID) | CUTANEOUS | 0 refills | Status: AC

## 2020-12-23 NOTE — Progress Notes (Signed)

## 2020-12-23 NOTE — Progress Notes (Signed)
I have spent 5 minutes in review of e-visit questionnaire, review and updating patient chart, medical decision making and response to patient.   Thaily Hackworth Cody Armelia Penton, PA-C    

## 2020-12-23 NOTE — Progress Notes (Signed)
Message sent to patient requesting further input regarding current symptoms. Awaiting patient response.  

## 2021-05-11 ENCOUNTER — Encounter: Payer: No Typology Code available for payment source | Admitting: Obstetrics

## 2021-07-18 NOTE — Progress Notes (Unsigned)
   New Patient Office Visit  Subjective    Patient ID: Alyssa Olson, female    DOB: 1990/03/25  Age: 31 y.o. MRN: [id number]  CC: No chief complaint on file.   HPI Alyssa Olson presents for new patient visit to establish care.  Introduced to Publishing rights manager role and practice setting.  All questions answered.  Discussed provider/patient relationship and expectations.   Outpatient Encounter Medications as of 07/21/2021  Medication Sig   fluocinolone (SYNALAR) 0.01 % external solution Apply topically 2 (two) times daily.   ibuprofen (ADVIL) 800 MG tablet Take 1 tablet (800 mg total) by mouth every 8 (eight) hours as needed for mild pain.   triamcinolone cream (KENALOG) 0.1 % Apply 1 application topically 2 (two) times daily.   No facility-administered encounter medications on file as of 07/21/2021.    Past Medical History:  Diagnosis Date   Acute appendicitis    Bacterial vaginosis    Bacterial vaginosis    History of chlamydia     Past Surgical History:  Procedure Laterality Date   LAPAROSCOPIC APPENDECTOMY N/A 08/05/2016   Procedure: APPENDECTOMY LAPAROSCOPIC;  Surgeon: Henrene Dodge, MD;  Location: [facility] ORS;  Service: General;  Laterality: N/A;    Family History  Problem Relation Age of Onset   Healthy Mother    Healthy Father    Cancer Maternal Grandfather        colon   Hypertension Maternal Grandfather     Social History   Socioeconomic History   Marital status: Single    Spouse name: Not on file   Number of children: Not on file   Years of education: Not on file   Highest education level: Not on file  Occupational History   Not on file  Tobacco Use   Smoking status: Every Day    Packs/day: 0.25    Types: Cigars, Cigarettes   Smokeless tobacco: Never  Vaping Use   Vaping Use: Never used  Substance and Sexual Activity   Alcohol use: Yes    Comment: socially   Drug use: No   Sexual activity: Yes    Birth control/protection: None   Other Topics Concern   Not on file  Social History Narrative   ** Merged History Encounter **       Social Determinants of Health   Financial Resource Strain: Not on file  Food Insecurity: Not on file  Transportation Needs: Not on file  Physical Activity: Not on file  Stress: Not on file  Social Connections: Not on file  Intimate Partner Violence: Not on file    ROS      Objective    There were no vitals taken for this visit.  Physical Exam  {Labs (Optional):23779}    Assessment & Plan:   Problem List Items Addressed This Visit   None   No follow-ups on file.   Gerre Scull, NP

## 2021-07-20 ENCOUNTER — Encounter: Payer: No Typology Code available for payment source | Admitting: Obstetrics

## 2021-07-21 ENCOUNTER — Encounter: Payer: No Typology Code available for payment source | Admitting: Radiology

## 2021-07-21 ENCOUNTER — Ambulatory Visit (INDEPENDENT_AMBULATORY_CARE_PROVIDER_SITE_OTHER): Payer: No Typology Code available for payment source | Admitting: Nurse Practitioner

## 2021-07-21 ENCOUNTER — Encounter: Payer: Self-pay | Admitting: Nurse Practitioner

## 2021-07-21 VITALS — BP 116/94 | HR 93 | Temp 97.7°F | Ht 61.0 in | Wt 176.4 lb

## 2021-07-21 DIAGNOSIS — N920 Excessive and frequent menstruation with regular cycle: Secondary | ICD-10-CM | POA: Diagnosis not present

## 2021-07-21 DIAGNOSIS — R030 Elevated blood-pressure reading, without diagnosis of hypertension: Secondary | ICD-10-CM

## 2021-07-21 DIAGNOSIS — M778 Other enthesopathies, not elsewhere classified: Secondary | ICD-10-CM

## 2021-07-21 DIAGNOSIS — N760 Acute vaginitis: Secondary | ICD-10-CM | POA: Diagnosis not present

## 2021-07-21 DIAGNOSIS — F32A Depression, unspecified: Secondary | ICD-10-CM | POA: Insufficient documentation

## 2021-07-21 DIAGNOSIS — E669 Obesity, unspecified: Secondary | ICD-10-CM | POA: Diagnosis not present

## 2021-07-21 DIAGNOSIS — Z13220 Encounter for screening for lipoid disorders: Secondary | ICD-10-CM

## 2021-07-21 DIAGNOSIS — F419 Anxiety disorder, unspecified: Secondary | ICD-10-CM

## 2021-07-21 DIAGNOSIS — B9689 Other specified bacterial agents as the cause of diseases classified elsewhere: Secondary | ICD-10-CM | POA: Insufficient documentation

## 2021-07-21 DIAGNOSIS — M25571 Pain in right ankle and joints of right foot: Secondary | ICD-10-CM | POA: Insufficient documentation

## 2021-07-21 DIAGNOSIS — M25572 Pain in left ankle and joints of left foot: Secondary | ICD-10-CM

## 2021-07-21 MED ORDER — DULOXETINE HCL 30 MG PO CPEP: 30.0000 mg | ORAL_CAPSULE | Freq: Every day | ORAL | 1 refills | Status: DC

## 2021-07-21 MED ORDER — METRONIDAZOLE 0.75 % VA GEL: 1.0000 | VAGINAL | 2 refills | Status: DC

## 2021-07-21 MED ORDER — MELOXICAM 15 MG PO TABS: 15.0000 mg | ORAL_TABLET | Freq: Every day | ORAL | 0 refills | Status: AC

## 2021-07-21 MED ORDER — IBUPROFEN 800 MG PO TABS: 800.0000 mg | ORAL_TABLET | Freq: Three times a day (TID) | ORAL | 0 refills | Status: DC | PRN

## 2021-07-21 NOTE — Assessment & Plan Note (Signed)
BMI 33.3.  She states that she has gained about 20 to 30 pounds in the last 3 months.  Discussed nutrition and exercise.  With her uncontrolled anxiety and elevated blood pressure, she is not a candidate right now for phentermine.  We will check A1c and TSH to see if this has any cause for recent weight gain.  Can consider other weight loss medication in the future.  Follow-up in 2 to 3 weeks.

## 2021-07-21 NOTE — Assessment & Plan Note (Signed)
She has a history of recurrent BV and was most recently diagnosed this morning at urgent care.  She was prescribed oral metronidazole twice a day for 7 days.  Encouraged her to complete this course, then she can start metronidazole gel vaginally twice a week to help prevent BV infections.  Discussed wearing cotton underwear, loose pants.  Follow-up with any concerns.

## 2021-07-21 NOTE — Assessment & Plan Note (Signed)
Chronic, stable.  She takes ibuprofen 800 mg as needed to help control her cramping.  Refill of this sent to the pharmacy, do not take on the same day as meloxicam.

## 2021-07-21 NOTE — Assessment & Plan Note (Signed)
Pain in right wrist consistent with tendinitis.  She had a positive Finkelstein and Phalen's test.  Could have a possible carpal tunnel associated with this as well.  We will have her start meloxicam 15 mg daily.  Continue wearing wrist brace especially at night.  She can also use ice intermittently.  Discussed not to take ibuprofen if she is taking the meloxicam.  Follow-up in 2 to 3 weeks.

## 2021-07-21 NOTE — Assessment & Plan Note (Signed)
She has noticed bilateral ankle pain and swelling.  Mild tenderness noted on exam today, no swelling noted.  Start meloxicam 15 mg daily.  We will check ANA, RF.  Follow-up in 2 to 3 weeks.

## 2021-07-21 NOTE — Assessment & Plan Note (Signed)
Blood pressure is elevated today at 116/94.  Discussed limiting salt in her diet.

## 2021-07-21 NOTE — Patient Instructions (Signed)
It was great to see you!  Start meloxicam once a day with food to help your wrist. If you need anything else for pain, you can take tylenol. Wear the brace, especially at night.   Start metronidazole gel vaginally twice a week after you finish the pills.   Start duloxetine 1 capsule daily for anxiety.   Let's follow-up in 2-3 weeks, sooner if you have concerns.  If a referral was placed today, you will be contacted for an appointment. Please note that routine referrals can sometimes take up to 3-4 weeks to process. Please call our office if you haven't heard anything after this time frame.  Take care,  Rodman Pickle, NP

## 2021-07-21 NOTE — Assessment & Plan Note (Signed)
She has been having anxiety and depression for the past several months.  She states that she worries about many things.  Her PHQ-9 is a 15 and her GAD-7 is a 13.  She denies SI/HI.  We will start duloxetine 30 mg daily.  Discussed possible side effects.  Follow-up in 2 to 3 weeks.

## 2021-07-22 LAB — COMPREHENSIVE METABOLIC PANEL
ALT: 28 U/L (ref 0–35)
AST: 26 U/L (ref 0–37)
Albumin: 4.6 g/dL (ref 3.5–5.2)
Alkaline Phosphatase: 59 U/L (ref 39–117)
BUN: 10 mg/dL (ref 6–23)
CO2: 19 mEq/L (ref 19–32)
Calcium: 9.7 mg/dL (ref 8.4–10.5)
Chloride: 104 mEq/L (ref 96–112)
Creatinine, Ser: 0.66 mg/dL (ref 0.40–1.20)
GFR: 118.36 mL/min (ref >60.00)
Glucose, Bld: 81 mg/dL (ref 70–99)
Potassium: 3.8 mEq/L (ref 3.5–5.1)
Sodium: 135 mEq/L (ref 135–145)
Total Bilirubin: 0.4 mg/dL (ref 0.2–1.2)
Total Protein: 7.7 g/dL (ref 6.0–8.3)

## 2021-07-22 LAB — CBC WITH DIFFERENTIAL/PLATELET
Basophils Absolute: 0.1 10*3/uL (ref 0.0–0.1)
Basophils Relative: 0.6 % (ref 0.0–3.0)
Eosinophils Absolute: 0.1 10*3/uL (ref 0.0–0.7)
Eosinophils Relative: 0.9 % (ref 0.0–5.0)
HCT: 43.4 % (ref 36.0–46.0)
Hemoglobin: 14.2 g/dL (ref 12.0–15.0)
Lymphocytes Relative: 18.3 % (ref 12.0–46.0)
Lymphs Abs: 2.7 10*3/uL (ref 0.7–4.0)
MCHC: 32.7 g/dL (ref 30.0–36.0)
MCV: 89 fl (ref 78.0–100.0)
Monocytes Absolute: 0.7 10*3/uL (ref 0.1–1.0)
Monocytes Relative: 5 % (ref 3.0–12.0)
Neutro Abs: 10.9 10*3/uL — ABNORMAL HIGH (ref 1.4–7.7)
Neutrophils Relative %: 75.2 % (ref 43.0–77.0)
Platelets: 329 10*3/uL (ref 150.0–400.0)
RBC: 4.88 Mil/uL (ref 3.87–5.11)
RDW: 13.7 % (ref 11.5–15.5)
WBC: 14.5 10*3/uL — ABNORMAL HIGH (ref 4.0–10.5)

## 2021-07-22 LAB — LIPID PANEL
Cholesterol: 242 mg/dL — ABNORMAL HIGH (ref 0–200)
HDL: 37.5 mg/dL — ABNORMAL LOW (ref >39.00)
LDL Cholesterol: 169 mg/dL — ABNORMAL HIGH (ref 0–99)
NonHDL: 204.94
Total CHOL/HDL Ratio: 6
Triglycerides: 182 mg/dL — ABNORMAL HIGH (ref 0.0–149.0)
VLDL: 36.4 mg/dL (ref 0.0–40.0)

## 2021-07-22 LAB — VITAMIN B12: Vitamin B-12: 390 pg/mL (ref 211–911)

## 2021-07-22 LAB — RHEUMATOID FACTOR: Rheumatoid fact SerPl-aCnc: <14 IU/mL (ref <14)

## 2021-07-22 LAB — HEMOGLOBIN A1C: Hgb A1c MFr Bld: 5.5 % (ref 4.6–6.5)

## 2021-07-22 LAB — TSH: TSH: 1.8 u[IU]/mL (ref 0.35–5.50)

## 2021-07-22 LAB — ANA W/REFLEX: Anti Nuclear Antibody (ANA): NEGATIVE

## 2021-08-04 ENCOUNTER — Ambulatory Visit: Payer: No Typology Code available for payment source | Admitting: Nurse Practitioner

## 2021-08-18 ENCOUNTER — Encounter: Payer: Self-pay | Admitting: Nurse Practitioner

## 2021-08-18 ENCOUNTER — Ambulatory Visit (INDEPENDENT_AMBULATORY_CARE_PROVIDER_SITE_OTHER): Payer: No Typology Code available for payment source | Admitting: Nurse Practitioner

## 2021-08-18 VITALS — BP 110/80 | HR 72 | Temp 96.6°F | Wt 173.0 lb

## 2021-08-18 DIAGNOSIS — M778 Other enthesopathies, not elsewhere classified: Secondary | ICD-10-CM

## 2021-08-18 DIAGNOSIS — M25572 Pain in left ankle and joints of left foot: Secondary | ICD-10-CM

## 2021-08-18 DIAGNOSIS — M25571 Pain in right ankle and joints of right foot: Secondary | ICD-10-CM

## 2021-08-18 DIAGNOSIS — R030 Elevated blood-pressure reading, without diagnosis of hypertension: Secondary | ICD-10-CM

## 2021-08-18 DIAGNOSIS — F419 Anxiety disorder, unspecified: Secondary | ICD-10-CM | POA: Diagnosis not present

## 2021-08-18 DIAGNOSIS — F32A Depression, unspecified: Secondary | ICD-10-CM

## 2021-08-18 MED ORDER — ONDANSETRON 4 MG PO TBDP: 4.0000 mg | ORAL_TABLET | Freq: Three times a day (TID) | ORAL | 0 refills | Status: DC | PRN

## 2021-08-18 NOTE — Patient Instructions (Signed)
It was great to see you!  Start taking the duloxetine at night and you can take zofran as needed for nausea.   Let's follow-up in 2-3 weeks, sooner if you have concerns.  If a referral was placed today, you will be contacted for an appointment. Please note that routine referrals can sometimes take up to 3-4 weeks to process. Please call our office if you haven't heard anything after this time frame.  Take care,  Rodman Pickle, NP

## 2021-08-18 NOTE — Progress Notes (Signed)
   Established Patient Office Visit  Subjective   Patient ID: Alyssa Olson, female    DOB: November 27, 1990  Age: 31 y.o. MRN: [id number]  Chief Complaint  Patient presents with   Follow-up    2 wk f/u Wrist tendonitis, anxeity. Pt states duloxetine makes her nauseous and would like something to help with nausea.      HPI  Alyssa Olson is here to follow-up on elevated blood pressure, wrist tendonitis, ankle pain, and anxiety.   Last visit she was started on meloxicam to help with her wrist and her ankle pain. She states the pain has improved and she is only needing to take the medication as needed.   She was also started on duloxetine to help with her anxiety. She states that the medication has really helped with her anxiety, however she is experiencing some nausea. She would like to continue this medication for now since it is helping overall.     ROS See pertinent positives and negatives per HPI.    Objective:     BP 110/80 (BP Location: Left Arm, Cuff Size: Large)   Pulse 72   Temp (!) 96.6 F (35.9 C) (Temporal)   Wt 173 lb (78.5 kg)   SpO2 96%   BMI 32.69 kg/m    Physical Exam Vitals and nursing note reviewed.  Constitutional:      General: She is not in acute distress.    Appearance: Normal appearance.  HENT:     Head: Normocephalic.  Eyes:     Conjunctiva/sclera: Conjunctivae normal.  Cardiovascular:     Rate and Rhythm: Normal rate and regular rhythm.     Pulses: Normal pulses.     Heart sounds: Normal heart sounds.  Pulmonary:     Effort: Pulmonary effort is normal.     Breath sounds: Normal breath sounds.  Musculoskeletal:     Cervical back: Normal range of motion.  Skin:    General: Skin is warm.  Neurological:     General: No focal deficit present.     Mental Status: She is alert and oriented to person, place, and time.  Psychiatric:        Mood and Affect: Mood normal.        Behavior: Behavior normal.        Thought Content:  Thought content normal.        Judgment: Judgment normal.      Assessment & Plan:   Problem List Items Addressed This Visit       Musculoskeletal and Integument   Wrist tendonitis    Wrist pain has improved. Continue meloxicam 15mg  prn daily. Follow-up if symptoms worsen or any concerns.         Other   Elevated blood pressure reading    Blood pressure is back down to normal.       Acute bilateral ankle pain    Pain has resolved. Continue meloxicam 15mg  daily prn.       Anxiety and depression - Primary    Chronic, improving. She states the duloxetine is helping, however she is experiencing nausea. She would like to continue this medication for now. Recommend she start taking the duloxetine at bedtime to see if this helps the nausea. Will also give her zofran 4mg  prn. Follow-up in 2-3 weeks, may be virtual.        Return in about 3 weeks (around 09/08/2021) for 2-3 weeks virtual, Anxiety.    , NP

## 2021-08-19 NOTE — Assessment & Plan Note (Signed)
Chronic, improving. She states the duloxetine is helping, however she is experiencing nausea. She would like to continue this medication for now. Recommend she start taking the duloxetine at bedtime to see if this helps the nausea. Will also give her zofran 4mg  prn. Follow-up in 2-3 weeks, may be virtual.

## 2021-08-19 NOTE — Assessment & Plan Note (Signed)
Pain has resolved. Continue meloxicam 15mg  daily prn.

## 2021-08-19 NOTE — Assessment & Plan Note (Signed)
Wrist pain has improved. Continue meloxicam 15mg  prn daily. Follow-up if symptoms worsen or any concerns.

## 2021-08-19 NOTE — Assessment & Plan Note (Signed)
Blood pressure is back down to normal.

## 2021-09-01 ENCOUNTER — Encounter: Payer: Self-pay | Admitting: Nurse Practitioner

## 2021-09-08 NOTE — Progress Notes (Signed)
Error - no show

## 2021-09-09 ENCOUNTER — Encounter: Payer: No Typology Code available for payment source | Admitting: Nurse Practitioner

## 2021-09-28 ENCOUNTER — Encounter: Payer: Self-pay | Admitting: Family Medicine

## 2021-09-28 ENCOUNTER — Ambulatory Visit (INDEPENDENT_AMBULATORY_CARE_PROVIDER_SITE_OTHER): Payer: No Typology Code available for payment source | Admitting: Family Medicine

## 2021-09-28 ENCOUNTER — Other Ambulatory Visit (HOSPITAL_COMMUNITY)
Admission: RE | Admit: 2021-09-28 | Discharge: 2021-09-28 | Disposition: A | Discharge status: Home / Self Care | Payer: No Typology Code available for payment source | Source: Ambulatory Visit | Attending: Family Medicine | Admitting: Family Medicine

## 2021-09-28 VITALS — BP 136/90 | HR 97 | Ht 61.0 in | Wt 173.0 lb

## 2021-09-28 DIAGNOSIS — N760 Acute vaginitis: Secondary | ICD-10-CM | POA: Insufficient documentation

## 2021-09-28 DIAGNOSIS — N914 Secondary oligomenorrhea: Secondary | ICD-10-CM | POA: Diagnosis not present

## 2021-09-28 DIAGNOSIS — Z113 Encounter for screening for infections with a predominantly sexual mode of transmission: Secondary | ICD-10-CM | POA: Insufficient documentation

## 2021-09-28 DIAGNOSIS — Z3169 Encounter for other general counseling and advice on procreation: Secondary | ICD-10-CM | POA: Diagnosis not present

## 2021-09-28 NOTE — Progress Notes (Signed)
Patient here to establish care. Pt has had annual w/ PCP Lauren McElwee.   LMP:08/04/2021 Last Pap: 11/2021WNL  Contraception: None  STD Screening: Desires Full Panel   CC: Has not had a cycle since July has took 3 at home UPT's all have been Negative.   *Pt states she has Hx of Ovarian Cyst.   Flu Vaccine Declined.

## 2021-09-28 NOTE — Assessment & Plan Note (Signed)
Having regular cycles so unlikely--though may need more w/u to decide especially if cycles become more irregular.

## 2021-09-28 NOTE — Progress Notes (Addendum)
    Subjective:    Patient ID: Alyssa Olson is a 31 y.o. female presenting with Establish Care  on 09/28/2021  HPI: Reports painful cycles, on ibuprofen. Usually regular but late now with negative UPT. Has u/s in past which shows possible PCOS. Reports infertility for the last 12 years. Has h/o chlamydia and appy. No partner right now, but knows would need w/u. Wants more kids. Does not want contraception at this time. Has h/o recurrent BV. Does take baths.  Review of Systems  Constitutional:  Positive for unexpected weight change. Negative for chills and fever.  Respiratory:  Negative for shortness of breath.   Cardiovascular:  Negative for chest pain.  Gastrointestinal:  Negative for abdominal pain, nausea and vomiting.  Endocrine: Positive for heat intolerance.  Genitourinary:  Negative for dysuria.  Skin:  Negative for rash.  Psychiatric/Behavioral:  Positive for sleep disturbance.       Objective:    BP (!) 136/90   Pulse 97   Ht 5\' 1"  (1.549 m)   Wt 173 lb (78.5 kg)   LMP 08/04/2021   BMI 32.69 kg/m  Physical Exam Exam conducted with a chaperone present.  Constitutional:      General: She is not in acute distress.    Appearance: She is well-developed.  HENT:     Head: Normocephalic and atraumatic.  Eyes:     General: No scleral icterus. Cardiovascular:     Rate and Rhythm: Normal rate.  Pulmonary:     Effort: Pulmonary effort is normal.  Abdominal:     Palpations: Abdomen is soft.  Genitourinary:    Comments: BUS normal, vagina is pink and rugated, uterus is small and anteverted, no adnexal mass or tenderness.  Musculoskeletal:     Cervical back: Neck supple.  Skin:    General: Skin is warm and dry.  Neurological:     Mental Status: She is alert and oriented to person, place, and time.         Assessment & Plan:   Problem List Items Addressed This Visit       Unprioritized   Recurrent vaginitis    No baths, boric acid 2x/week or after  intercourse and menses      Infertility counseling    Return when ready to pursue w/u      Other Visit Diagnoses     Screen for STD (sexually transmitted disease)    -  Primary   Wants STD screen   Relevant Orders   HIV Antibody (routine testing w rflx)   RPR   Hepatitis C antibody   Hepatitis B surface antigen   Cervicovaginal ancillary only( [place])   POCT urine pregnancy   Secondary oligomenorrhea       Negative UPT--check Thyroid as has some other sx's of overactivity.    Relevant Orders   TSH      Wet prep shows BV--rx flagyl. Return in about 1 year (around 09/29/2022), or if symptoms worsen or fail to improve.  10/01/2022, MD 09/28/2021 9:26 AM

## 2021-09-28 NOTE — Assessment & Plan Note (Signed)
Return when ready to pursue w/u

## 2021-09-28 NOTE — Assessment & Plan Note (Signed)
No baths, boric acid 2x/week or after intercourse and menses

## 2021-09-29 LAB — CERVICOVAGINAL ANCILLARY ONLY
Bacterial Vaginitis (gardnerella): POSITIVE — AB
Candida Glabrata: NEGATIVE
Candida Vaginitis: NEGATIVE
Chlamydia: NEGATIVE
Comment: NEGATIVE
Comment: NEGATIVE
Comment: NEGATIVE
Comment: NEGATIVE
Comment: NEGATIVE
Comment: NORMAL
Neisseria Gonorrhea: NEGATIVE
Trichomonas: NEGATIVE

## 2021-09-29 MED ORDER — METRONIDAZOLE 500 MG PO TABS: 500.0000 mg | ORAL_TABLET | Freq: Two times a day (BID) | ORAL | 0 refills | Status: AC

## 2021-09-29 NOTE — Addendum Note (Signed)
Addended by: Reva Bores on: 09/29/2021 04:52 PM   Modules accepted: Orders

## 2021-09-30 ENCOUNTER — Encounter: Payer: No Typology Code available for payment source | Admitting: Obstetrics

## 2021-09-30 LAB — HIV ANTIBODY (ROUTINE TESTING W REFLEX): HIV Screen 4th Generation wRfx: NONREACTIVE

## 2021-09-30 LAB — SPECIMEN STATUS REPORT

## 2021-09-30 LAB — TSH: TSH: 2.23 u[IU]/mL (ref 0.450–4.500)

## 2021-09-30 LAB — RPR: RPR Ser Ql: NONREACTIVE

## 2021-09-30 LAB — HEPATITIS B SURFACE ANTIGEN: Hepatitis B Surface Ag: NEGATIVE

## 2021-09-30 LAB — HUMAN CHORIONIC GONADOTROPIN(HCG),B-SUBUNIT,QUANTITATIVE): HCG, Beta Chain, Quant, S: <1 m[IU]/mL

## 2021-09-30 LAB — HEPATITIS C ANTIBODY: Hep C Virus Ab: NONREACTIVE

## 2022-03-16 ENCOUNTER — Encounter: Payer: Self-pay | Admitting: Nurse Practitioner

## 2022-03-16 ENCOUNTER — Telehealth: Payer: No Typology Code available for payment source | Admitting: Nurse Practitioner

## 2022-03-16 DIAGNOSIS — R0683 Snoring: Secondary | ICD-10-CM

## 2022-03-16 DIAGNOSIS — J0100 Acute maxillary sinusitis, unspecified: Secondary | ICD-10-CM

## 2022-03-16 DIAGNOSIS — R6889 Other general symptoms and signs: Secondary | ICD-10-CM | POA: Diagnosis not present

## 2022-03-16 MED ORDER — AMOXICILLIN-POT CLAVULANATE 875-125 MG PO TABS: 1.0000 | ORAL_TABLET | Freq: Two times a day (BID) | ORAL | 0 refills | Status: DC

## 2022-03-16 MED ORDER — IBUPROFEN 800 MG PO TABS: 800.0000 mg | ORAL_TABLET | Freq: Three times a day (TID) | ORAL | 1 refills | Status: DC | PRN

## 2022-03-16 MED ORDER — FLUCONAZOLE 150 MG PO TABS: ORAL_TABLET | ORAL | 0 refills | Status: DC

## 2022-03-16 NOTE — Progress Notes (Signed)
[facility]a [facility] [address] [place] [address] Dept: [phone] Dept Fax: [phone]  Virtual Video Visit  I connected with Alyssa Olson on 03/16/22 at  4:20 PM EST by a video enabled telemedicine application and verified that I am speaking with the correct person using two identifiers.  Location patient: Home Location provider: Clinic Persons participating in the virtual visit: Patient; Vance Peper, NP; Eino Farber, CMA  I discussed the limitations of evaluation and management by telemedicine and the availability of in person appointments. The patient expressed understanding and agreed to proceed.  Chief Complaint  Patient presents with   Sinusitis    Congestion, sinus pain    SUBJECTIVE:  HPI: Alyssa Olson is a 32 y.o. female who presents with congestion and sinus pain, sleep apnea, memory.   She has been experiencing congestion and sinus pain for the last week. She feels pressure in her ears and a sore throat. She denies fevers. She has tried alkaseltzer sinus and sudafed which has not really helped. She has not done a home covid test.   She has been experiencing some memory issues. She is having trouble retaining information recently. She feels like she needs to remind herself often about tasks that need to be done. She states her family has noticed some forgetfulness as well. She started noticing this for the last year.   She states that she has been snoring a lot at night. She states that this will sometimes wake her up. She feels tired in the mornings and not like she is getting restful sleep. She endorses frequent headaches which she will take ibuprofen for.   Patient Active Problem List   Diagnosis Date Noted   Recurrent vaginitis 09/28/2021   Infertility counseling 09/28/2021   Wrist tendonitis 07/21/2021   Menorrhagia with regular cycle 07/21/2021   Elevated blood pressure reading  07/21/2021   Acute bilateral ankle pain 07/21/2021   Anxiety and depression 07/21/2021   Ovarian cyst 06/17/2018   Hypertriglyceridemia 05/21/2015   Obesity (BMI 30-39.9) 11/09/2014    Past Surgical History:  Procedure Laterality Date   APPENDECTOMY     LAPAROSCOPIC APPENDECTOMY N/A 08/05/2016   Procedure: APPENDECTOMY LAPAROSCOPIC;  Surgeon: Olean Ree, MD;  Location: [facility] ORS;  Service: General;  Laterality: N/A;    Family History  Problem Relation Age of Onset   Healthy Mother    Healthy Father    Vision loss Maternal Grandmother    Cancer Maternal Grandfather        colon   Hypertension Maternal Grandfather    Arthritis Maternal Grandfather    Diabetes Maternal Grandfather     Social History   Tobacco Use   Smoking status: Some Days    Types: Cigars   Smokeless tobacco: Never  Vaping Use   Vaping Use: Never used  Substance Use Topics   Alcohol use: Yes    Alcohol/week: 10.0 standard drinks of alcohol    Types: 10 Shots of liquor per week    Comment: socially   Drug use: No     Current Outpatient Medications:    amoxicillin-clavulanate (AUGMENTIN) 875-125 MG tablet, Take 1 tablet by mouth 2 (two) times daily., Disp: 20 tablet, Rfl: 0   fluconazole (DIFLUCAN) 150 MG tablet, Take one tablet after finishing antibiotics and a second dose 3 days later if needed, Disp: 2 tablet, Rfl: 0   DULoxetine (CYMBALTA) 30 MG capsule, Take 1 capsule (30 mg total) by mouth daily., Disp:  30 capsule, Rfl: 1   fluocinolone (SYNALAR) 0.01 % external solution, Apply topically 2 (two) times daily., Disp: 60 mL, Rfl: 0   ibuprofen (ADVIL) 800 MG tablet, Take 1 tablet (800 mg total) by mouth every 8 (eight) hours as needed for mild pain., Disp: 30 tablet, Rfl: 1   ketoconazole (NIZORAL) 2 % cream, Apply 1 Application topically daily., Disp: , Rfl:    meloxicam (MOBIC) 15 MG tablet, Take 1 tablet (15 mg total) by mouth daily., Disp: 30 tablet, Rfl: 0   ondansetron (ZOFRAN-ODT) 4 MG  disintegrating tablet, Take 1 tablet (4 mg total) by mouth every 8 (eight) hours as needed for nausea or vomiting., Disp: 30 tablet, Rfl: 0   triamcinolone cream (KENALOG) 0.1 %, Apply 1 application topically 2 (two) times daily., Disp: 30 g, Rfl: 0  No Known Allergies  ROS: See pertinent positives and negatives per HPI.  OBSERVATIONS/OBJECTIVE:  VITALS per patient if applicable: There were no vitals filed for this visit. There is no height or weight on file to calculate BMI.    GENERAL: Alert and oriented. Appears well and in no acute distress.  HEENT: Atraumatic. Conjunctiva clear. No obvious abnormalities on inspection of external nose and ears.  NECK: Normal movements of the head and neck.  LUNGS: On inspection, no signs of respiratory distress. Breathing rate appears normal. No obvious gross SOB, gasping or wheezing, and no conversational dyspnea.  CV: No obvious cyanosis.  MS: Moves all visible extremities without noticeable abnormality.  PSYCH/NEURO: Pleasant and cooperative. No obvious depression or anxiety. Speech and thought processing grossly intact.  ASSESSMENT AND PLAN:  Problem List Items Addressed This Visit   None Visit Diagnoses     Acute non-recurrent maxillary sinusitis    -  Primary   Treat with augmentin BID x10 days. Encourage rest, fluids. Keep taking OTC medications. Diflucan after to prevent yeast. F/U if not improving.   Relevant Medications   amoxicillin-clavulanate (AUGMENTIN) 875-125 MG tablet   fluconazole (DIFLUCAN) 150 MG tablet   Snoring       Snoring at night with waking up, forgetfulness and headaches. Will refer to sleep medicine for sleep apnea testing.   Forgetfulness       She has been experiencing forgetfulness for about a year. May be related to sleep apnea with snoring. Referral placed to sleep medicine.        I discussed the assessment and treatment plan with the patient. The patient was provided an opportunity to ask  questions and all were answered. The patient agreed with the plan and demonstrated an understanding of the instructions.   The patient was advised to call back or seek an in-person evaluation if the symptoms worsen or if the condition fails to improve as anticipated.   Charyl Dancer, NP

## 2022-03-16 NOTE — Patient Instructions (Signed)
It was great to see you!  Start augmentin twice a day for 10 days with food.  Take diflucan at the end of antibiotics and 3 days later if needed for yeast symptoms.   I Have placed a referral to sleep medicine.   Let's follow-up at next scheduled appointment.   Take care,  Vance Peper, NP

## 2022-03-22 ENCOUNTER — Telehealth: Payer: No Typology Code available for payment source | Admitting: Nurse Practitioner

## 2022-04-02 IMAGING — CR DG ANKLE COMPLETE 3+V*L*
3 series · 3 of 3 positions shown · non-contrast
Comparison: None.

CLINICAL DATA: 30-year-old female status post fall. Left ankle
swelling, unable to weightbear.

EXAM:
LEFT ANKLE COMPLETE - 3+ VIEW

[ankle ap]
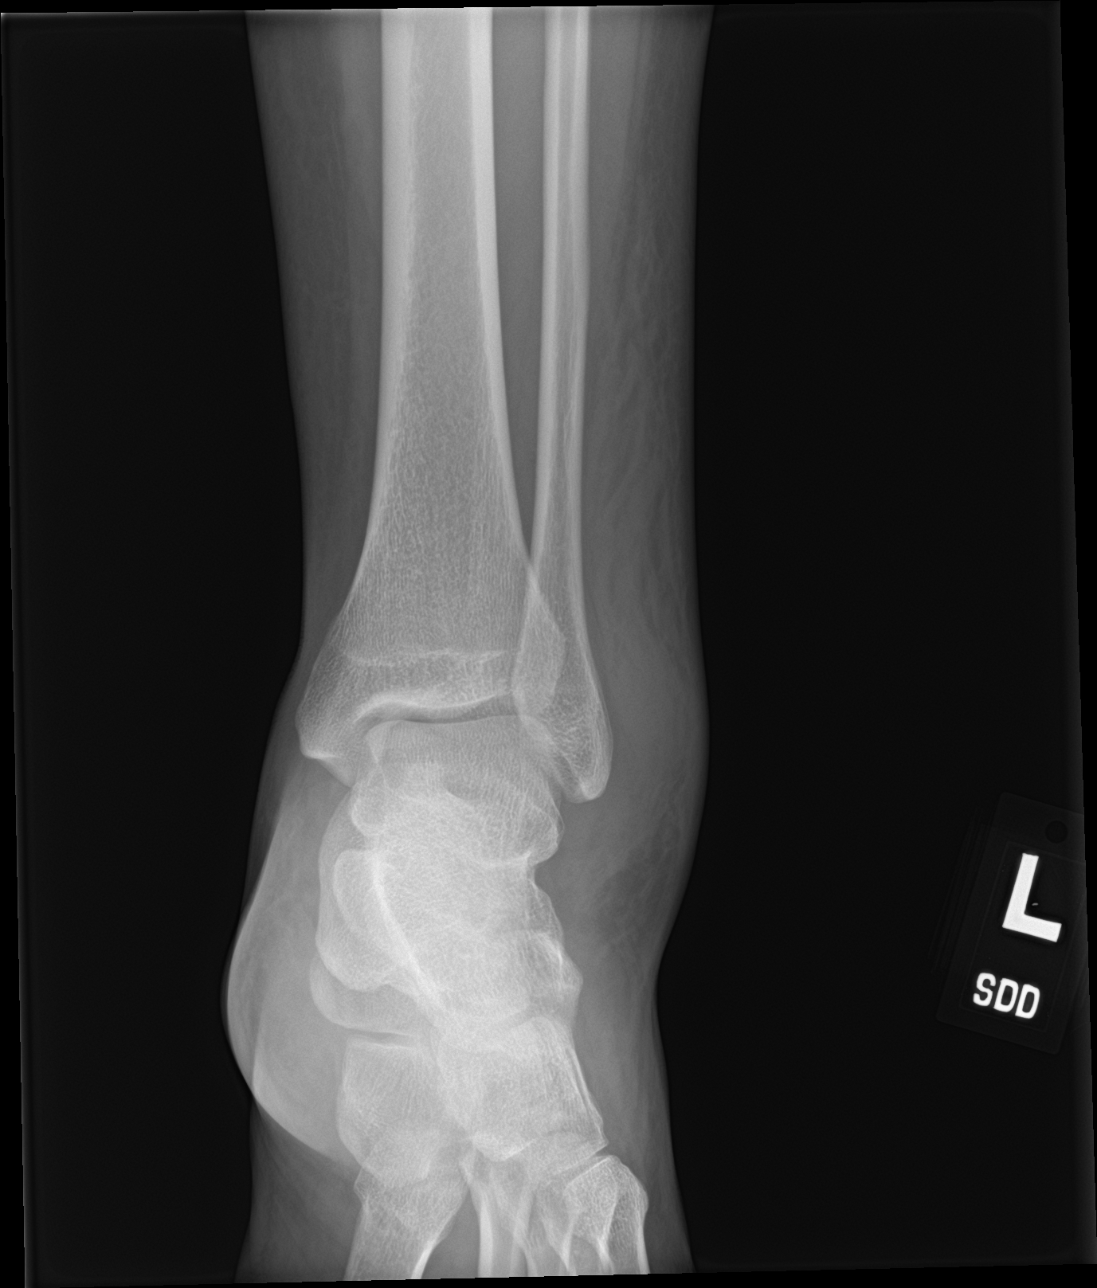

[ankle obl]
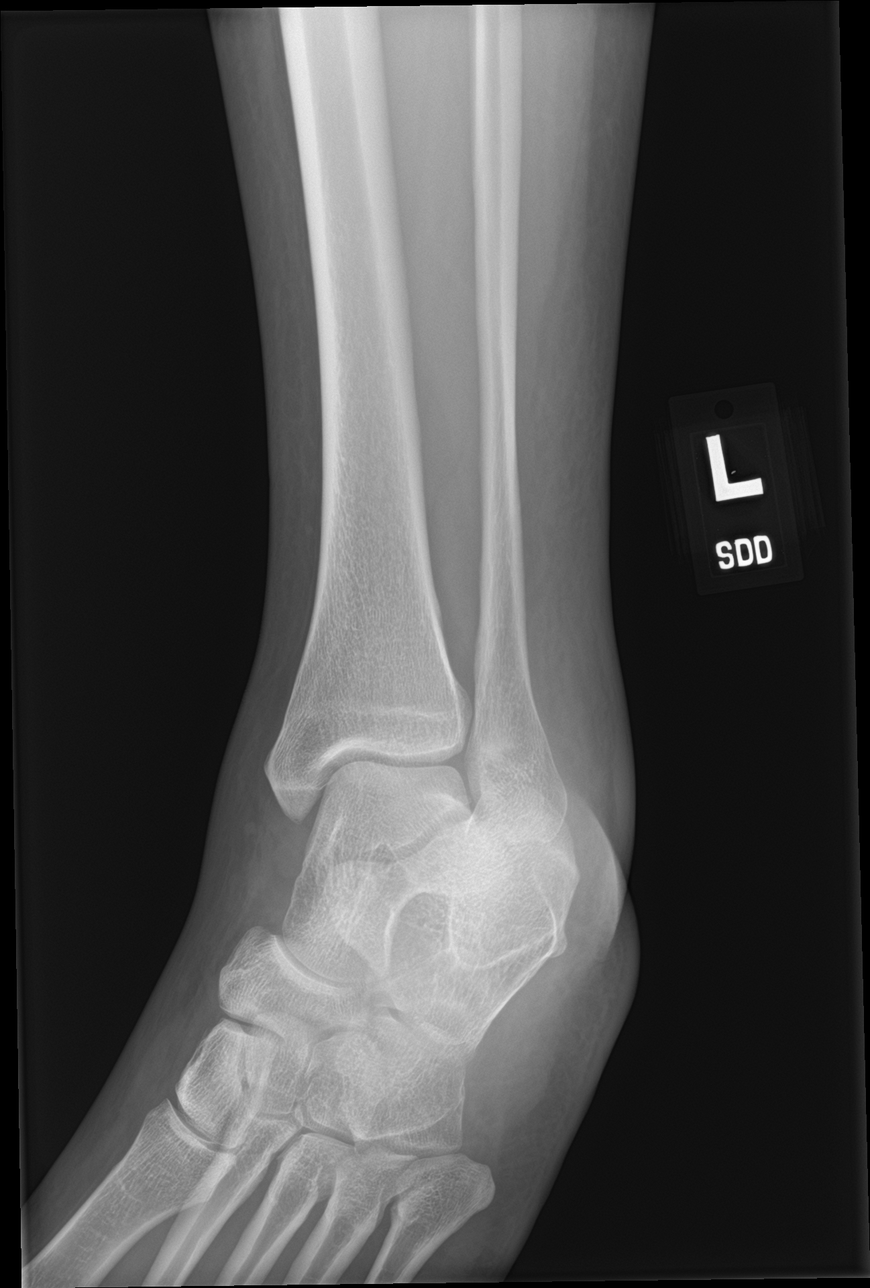

[ankle lat]
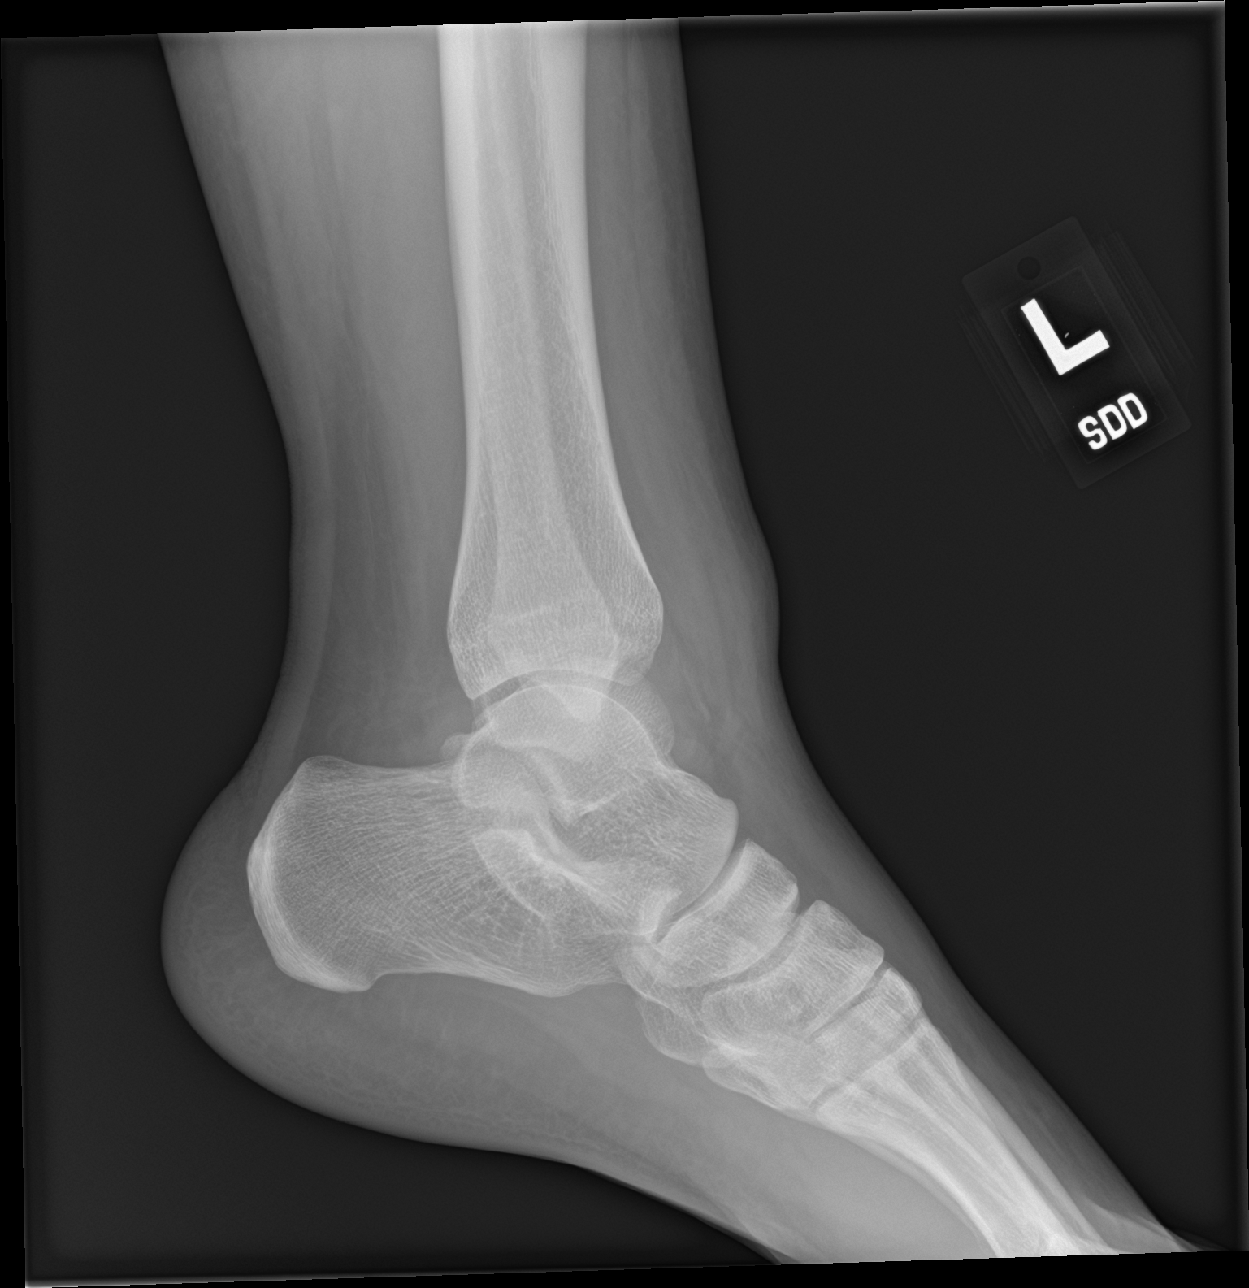

[3 of 3 positions shown; findings below may reference images not displayed]

FINDINGS: Bone mineralization is within normal limits. Mortise joint alignment
preserved. Talar dome intact. Possible joint effusion on the
lateral. And moderate to severe anterior and lateral soft tissue
swelling and stranding. But the distal tibia, fibula, calcaneus, and
other visible bones of the left foot appear intact.
IMPRESSION: Anterior and lateral soft tissue swelling plus evidence of joint
effusion.

But no acute fracture or dislocation identified about the left
ankle.

## 2022-04-18 ENCOUNTER — Encounter: Payer: Self-pay | Admitting: Neurology

## 2022-04-18 ENCOUNTER — Ambulatory Visit (INDEPENDENT_AMBULATORY_CARE_PROVIDER_SITE_OTHER): Payer: No Typology Code available for payment source | Admitting: Neurology

## 2022-04-18 VITALS — BP 123/85 | HR 78 | Ht 61.0 in | Wt 171.6 lb

## 2022-04-18 DIAGNOSIS — R351 Nocturia: Secondary | ICD-10-CM

## 2022-04-18 DIAGNOSIS — Z9189 Other specified personal risk factors, not elsewhere classified: Secondary | ICD-10-CM

## 2022-04-18 DIAGNOSIS — R519 Headache, unspecified: Secondary | ICD-10-CM

## 2022-04-18 DIAGNOSIS — E66811 Obesity, class 1: Secondary | ICD-10-CM

## 2022-04-18 DIAGNOSIS — R419 Unspecified symptoms and signs involving cognitive functions and awareness: Secondary | ICD-10-CM

## 2022-04-18 DIAGNOSIS — R0683 Snoring: Secondary | ICD-10-CM | POA: Diagnosis not present

## 2022-04-18 DIAGNOSIS — E669 Obesity, unspecified: Secondary | ICD-10-CM

## 2022-04-18 DIAGNOSIS — G4719 Other hypersomnia: Secondary | ICD-10-CM

## 2022-04-18 DIAGNOSIS — R635 Abnormal weight gain: Secondary | ICD-10-CM

## 2022-04-18 NOTE — Progress Notes (Signed)
Subjective:    Patient ID: Alyssa Olson is a 32 y.o. female.  HPI    Star Age, MD, PhD [facility] [address] [place], [place] [address]  Dear Ander Purpura,  I saw your patient, Alyssa Olson, upon your kind request in my sleep clinic today for initial consultation of her sleep disorder, in particular, concern for underlying obstructive sleep apnea.  The patient is accompanied by her son today.  As you know, [name] is a 32 year old female with an underlying medical history of anxiety, depression, ovarian cyst, hypertriglyceridemia, and obesity, who reports snoring and excessive daytime somnolence as well as recurrent headaches and nonrestorative sleep.  Her Epworth sleepiness score is 17 out of 24, fatigue severity score is 50 out of 63.  I reviewed your video visit note from 03/16/2022.  She has had daytime tiredness for years.  Her snoring and sleep disruption have become worse over the past year.  She has gained between 20 and 25 pounds within the past year.  She lives with her son and they have 1 dog in the household.  The dog does not typically sleep in her bedroom or on the bed.  She does have a TV on in her bedroom and turns it off when she wakes up in the middle of the night.  She has nocturia about once or twice per average night and has woken up occasionally with a headache.  She describes the headache as dull and achy, sometimes she takes over-the-counter ibuprofen.  Bedtime is generally between 11 and midnight, she has some difficulty maintaining sleep.  She does not take any over-the-counter sleep aid or prescription sleeping pills.  Rise time is around 6:30 AM.  She works from home and also has a part-time job in Scientist, research (medical).  She drinks 1 cup of coffee in the morning, 1 or 2 cups of tea during the day.  She drinks alcohol occasionally now, she is cut down from drinking alcohol nearly daily in the recent past.  She used to smoke  cigars daily, currently smokes Black and Mild once every 2 to 3 days.  Her Past Medical History Is Significant For: Past Medical History:  Diagnosis Date   Acute appendicitis    Anxiety    Bacterial vaginosis    History of chlamydia    Ovarian cyst     Her Past Surgical History Is Significant For: Past Surgical History:  Procedure Laterality Date   APPENDECTOMY     LAPAROSCOPIC APPENDECTOMY N/A 08/05/2016   Procedure: APPENDECTOMY LAPAROSCOPIC;  Surgeon: Olean Ree, MD;  Location: [facility] ORS;  Service: General;  Laterality: N/A;    Her Family History Is Significant For: Family History  Problem Relation Age of Onset   Healthy Mother    Healthy Father    Vision loss Maternal Grandmother    Cancer Maternal Grandfather        colon   Hypertension Maternal Grandfather    Arthritis Maternal Grandfather    Diabetes Maternal Grandfather     Her Social History Is Significant For: Social History   Socioeconomic History   Marital status: Single    Spouse name: Not on file   Number of children: Not on file   Years of education: Not on file   Highest education level: Not on file  Occupational History    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  Tobacco Use   Smoking status: Former    Types: Cigars   Smokeless tobacco:  Never  Vaping Use   Vaping Use: Never used  Substance and Sexual Activity   Alcohol use: Yes    Alcohol/week: 10.0 standard drinks of alcohol    Types: 10 Shots of liquor per week    Comment: socially   Drug use: No   Sexual activity: Yes    Birth control/protection: None  Other Topics Concern   Not on file  Social History Narrative   ** Merged History Encounter **    Caffiene; cup coffee daily   Working:  Therapist, art remote,  PT retail job   Social Determinants of Radio broadcast assistant Strain: Not on file  Food Insecurity: Not on file  Transportation Needs: Not on file  Physical Activity: Not on file  Stress: Not on file  Social  Connections: Not on file    Her Allergies Are:  No Known Allergies:   Her Current Medications Are:  Outpatient Encounter Medications as of 04/18/2022  Medication Sig   fluocinolone (SYNALAR) 0.01 % external solution Apply topically 2 (two) times daily.   ibuprofen (ADVIL) 800 MG tablet Take 1 tablet (800 mg total) by mouth every 8 (eight) hours as needed for mild pain.   ketoconazole (NIZORAL) 2 % cream Apply 1 Application topically daily.   meloxicam (MOBIC) 15 MG tablet Take 1 tablet (15 mg total) by mouth daily.   triamcinolone cream (KENALOG) 0.1 % Apply 1 application topically 2 (two) times daily.   [DISCONTINUED] amoxicillin-clavulanate (AUGMENTIN) 875-125 MG tablet Take 1 tablet by mouth 2 (two) times daily.   [DISCONTINUED] DULoxetine (CYMBALTA) 30 MG capsule Take 1 capsule (30 mg total) by mouth daily.   [DISCONTINUED] fluconazole (DIFLUCAN) 150 MG tablet Take one tablet after finishing antibiotics and a second dose 3 days later if needed   [DISCONTINUED] ondansetron (ZOFRAN-ODT) 4 MG disintegrating tablet Take 1 tablet (4 mg total) by mouth every 8 (eight) hours as needed for nausea or vomiting.   No facility-administered encounter medications on file as of 04/18/2022.  :   Review of Systems:  Out of a complete 14 point review of systems, all are reviewed and negative with the exception of these symptoms as listed below:  Review of Systems  Neurological:        Daytime sleepiness, snorning, fatigue.  ESS 17, FSS 50.      Objective:  Neurological Exam  Physical Exam Physical Examination:   Vitals:   04/18/22 1242  BP: 123/85  Pulse: 78    General Examination: The patient is a very pleasant 32 y.o. female in no acute distress. She appears well-developed and well-nourished and well groomed.   HEENT: Normocephalic, atraumatic, pupils are equal, round and reactive to light, extraocular tracking is good without limitation to gaze excursion or nystagmus noted. Hearing is  grossly intact. Face is symmetric with normal facial animation. Speech is clear with no dysarthria noted. There is no hypophonia. There is no lip, neck/head, jaw or voice tremor. Neck is supple with full range of passive and active motion. There are no carotid bruits on auscultation. Oropharynx exam reveals: mild mouth dryness, good dental hygiene and mild airway crowding, due to small airway entry, Mallampati class II.  Tonsils about 1+, wider tongue noted, slightly elongated uvula.  Neck circumference 15 1/8 inches.  Minimal overbite noted.  Tongue protrudes centrally and palate elevates symmetrically.  Chest: Clear to auscultation without wheezing, rhonchi or crackles noted.  Heart: S1+S2+0, regular and normal without murmurs, rubs or gallops noted.   Abdomen:  Soft, non-tender and non-distended.  Extremities: There is no edema in the distal lower extremities bilaterally.   Skin: Warm and dry without trophic changes noted.   Musculoskeletal: exam reveals no obvious joint deformities.   Neurologically:  Mental status: The patient is awake, alert and oriented in all 4 spheres. Her immediate and remote memory, attention, language skills and fund of knowledge are appropriate. There is no evidence of aphasia, agnosia, apraxia or anomia. Speech is clear with normal prosody and enunciation. Thought process is linear. Mood is normal and affect is normal.  Cranial nerves II - XII are as described above under HEENT exam.  Motor exam: Normal bulk, strength and tone is noted. There is no obvious action or resting tremor.  Fine motor skills and coordination: grossly intact.  Cerebellar testing: No dysmetria or intention tremor. There is no truncal or gait ataxia.  Sensory exam: intact to light touch in the upper and lower extremities.  Gait, station and balance: She stands easily. No veering to one side is noted. No leaning to one side is noted. Posture is age-appropriate and stance is narrow based. Gait  shows normal stride length and normal pace. No problems turning are noted.   Assessment and Plan:    In summary, Iria L Rogers-Leath is a very pleasant 32 y.o.-year old female with an underlying medical history of anxiety, depression, ovarian cyst, hypertriglyceridemia, and obesity, whose history and physical exam are concerning for sleep disordered breathing, particularly obstructive sleep apnea (OSA). A laboratory attended sleep study is typically considered "gold standard" for evaluation of sleep disordered breathing.   I had a long chat with the patient about my findings and the diagnosis of sleep apnea, particularly OSA, its prognosis and treatment options. We talked about medical/conservative treatments, surgical interventions and non-pharmacological approaches for symptom control. I explained, in particular, the risks and ramifications of untreated moderate to severe OSA, especially with respect to developing cardiovascular disease down the road, including congestive heart failure (CHF), difficult to treat hypertension, cardiac arrhythmias (particularly A-fib), neurovascular complications including TIA, stroke and dementia. Even type 2 diabetes has, in part, been linked to untreated OSA. Symptoms of untreated OSA may include (but may not be limited to) daytime sleepiness, nocturia (i.e. frequent nighttime urination), memory problems, mood irritability and suboptimally controlled or worsening mood disorder such as depression and/or anxiety, lack of energy, lack of motivation, physical discomfort, as well as recurrent headaches, especially morning or nocturnal headaches. We talked about the importance of maintaining a healthy lifestyle and striving for healthy weight.  The importance of complete smoking cessation was also addressed.  In addition, we talked about the importance of striving for and maintaining good sleep hygiene. I recommended a sleep study at this time. I outlined the differences between  a laboratory attended sleep study which is considered more comprehensive and accurate over the option of a home sleep test (HST); the latter may lead to underestimation of sleep disordered breathing in some instances and does not help with diagnosing upper airway resistance syndrome and is not accurate enough to diagnose primary central sleep apnea typically. I outlined possible surgical and non-surgical treatment options of OSA, including the use of a positive airway pressure (PAP) device (i.e. CPAP, AutoPAP/APAP or BiPAP in certain circumstances), a custom-made dental device (aka oral appliance, which would require a referral to a specialist dentist or orthodontist typically, and is generally speaking not considered for patients with full dentures or edentulous state), upper airway surgical options, such as traditional UPPP (  which is not considered a first-line treatment) or the Inspire device (hypoglossal nerve stimulator, which would involve a referral for consultation with an ENT surgeon, after careful selection, following inclusion criteria - also not first-line treatment). I explained the PAP treatment option to the patient in detail, as this is generally considered first-line treatment.  The patient indicated that she would be willing to try PAP therapy, if the need arises. I explained the importance of being compliant with PAP treatment, not only for insurance purposes but primarily to improve patient's symptoms symptoms, and for the patient's long term health benefit, including to reduce Her cardiovascular risks longer-term.    We will pick up our discussion about the next steps and treatment options after testing.  We will keep her posted as to the test results by phone call and/or MyChart messaging where possible.  We will plan to follow-up in sleep clinic accordingly as well.  I answered all her questions today and the patient was in agreement.   I encouraged her to call with any interim questions,  concerns, problems or updates or email [place] through [facility].  Generally speaking, sleep test authorizations may take up to 2 weeks, sometimes less, sometimes longer, the patient is encouraged to get in touch with [place] if they do not hear back from the sleep lab staff directly within the next 2 weeks.  Thank you very much for allowing me to participate in the care of this nice patient. If I can be of any further assistance to you please do not hesitate to call me at [phone].  Sincerely,   Star Age, MD, PhD

## 2022-04-18 NOTE — Patient Instructions (Signed)

## 2022-05-24 ENCOUNTER — Telehealth: Payer: Self-pay | Admitting: Neurology

## 2022-05-24 NOTE — Telephone Encounter (Signed)
[employer] NPSG & HST no auth req for either codes via auto machine ref # [id number].   Sent mychart message to the patient.

## 2022-07-12 ENCOUNTER — Other Ambulatory Visit (HOSPITAL_COMMUNITY)
Admission: RE | Admit: 2022-07-12 | Discharge: 2022-07-12 | Disposition: A | Discharge status: Home / Self Care | Payer: No Typology Code available for payment source | Source: Ambulatory Visit | Attending: Family Medicine | Admitting: Family Medicine

## 2022-07-12 ENCOUNTER — Ambulatory Visit: Payer: No Typology Code available for payment source

## 2022-07-12 VITALS — BP 122/89 | HR 109 | Wt 170.0 lb

## 2022-07-12 DIAGNOSIS — N898 Other specified noninflammatory disorders of vagina: Secondary | ICD-10-CM

## 2022-07-12 NOTE — Progress Notes (Signed)
SUBJECTIVE:  32 y.o. female who desires a STI/STD swab.  Does Note abnormal vaginal discharge  Denies any bleeding or significant pelvic pain. No UTI symptoms.   No LMP recorded.  OBJECTIVE:  She appears well.   ASSESSMENT:  STI Screen   PLAN:  Pt offered STI blood screening-not indicated GC, chlamydia, and trichomonas probe sent to lab.  Treatment: To be determined once lab results are received.  Pt follow up as needed.

## 2022-07-14 LAB — CERVICOVAGINAL ANCILLARY ONLY
Bacterial Vaginitis (gardnerella): POSITIVE — AB
Candida Glabrata: NEGATIVE
Candida Vaginitis: NEGATIVE
Chlamydia: NEGATIVE
Comment: NEGATIVE
Comment: NEGATIVE
Comment: NEGATIVE
Comment: NEGATIVE
Comment: NEGATIVE
Comment: NORMAL
Neisseria Gonorrhea: NEGATIVE
Trichomonas: POSITIVE — AB

## 2022-07-14 MED ORDER — METRONIDAZOLE 500 MG PO TABS: 500.0000 mg | ORAL_TABLET | Freq: Two times a day (BID) | ORAL | 0 refills | Status: AC

## 2022-07-14 NOTE — Addendum Note (Signed)
Addended by: Reva Bores on: 07/14/2022 03:52 PM   Modules accepted: Orders

## 2022-08-16 ENCOUNTER — Encounter (INDEPENDENT_AMBULATORY_CARE_PROVIDER_SITE_OTHER): Payer: Self-pay

## 2022-09-01 ENCOUNTER — Telehealth: Payer: Self-pay

## 2022-09-01 ENCOUNTER — Ambulatory Visit: Payer: No Typology Code available for payment source | Admitting: Nurse Practitioner

## 2022-09-01 ENCOUNTER — Encounter: Payer: Self-pay | Admitting: Nurse Practitioner

## 2022-09-01 VITALS — BP 120/88 | HR 90 | Temp 97.0°F | Ht 61.0 in | Wt 172.0 lb

## 2022-09-01 DIAGNOSIS — E669 Obesity, unspecified: Secondary | ICD-10-CM | POA: Diagnosis not present

## 2022-09-01 DIAGNOSIS — L219 Seborrheic dermatitis, unspecified: Secondary | ICD-10-CM | POA: Insufficient documentation

## 2022-09-01 DIAGNOSIS — F32A Depression, unspecified: Secondary | ICD-10-CM | POA: Diagnosis not present

## 2022-09-01 DIAGNOSIS — F419 Anxiety disorder, unspecified: Secondary | ICD-10-CM

## 2022-09-01 DIAGNOSIS — Z6832 Body mass index (BMI) 32.0-32.9, adult: Secondary | ICD-10-CM

## 2022-09-01 MED ORDER — KETOCONAZOLE 2 % EX SHAM: 1.0000 | MEDICATED_SHAMPOO | Freq: Once | CUTANEOUS | 1 refills | Status: AC

## 2022-09-01 MED ORDER — FLUOCINOLONE ACETONIDE 0.01 % EX SOLN: Freq: Two times a day (BID) | CUTANEOUS | 1 refills | Status: DC

## 2022-09-01 MED ORDER — IBUPROFEN 800 MG PO TABS: 800.0000 mg | ORAL_TABLET | Freq: Three times a day (TID) | ORAL | 1 refills | Status: DC | PRN

## 2022-09-01 MED ORDER — ZEPBOUND 2.5 MG/0.5ML ~~LOC~~ SOAJ: 2.5000 mg | SUBCUTANEOUS | 1 refills | Status: DC

## 2022-09-01 NOTE — Telephone Encounter (Signed)
Received a fax that Prior Auth is needed on Zepbound 2.5mg /0.51ml [id number]  Please call plan at [phone]  Patient ID #

## 2022-09-01 NOTE — Progress Notes (Signed)
Established Patient Office Visit  Subjective   Patient ID: Alyssa Olson, female    DOB: 29-Jul-1990  Age: 32 y.o. MRN: [id number]  Chief Complaint  Patient presents with   Weight Management Screening    Seborrheic dermatitis, Rx refills    HPI  Alyssa Olson is here to follow-up on seborrheic dermatitis and weight management.   She is taking ketoconazole shampoo and using fluocinolone external solution which is helping control her seborrheic dermatitis.  She would like a refill of this as she is out of the medications.  She has been trying to lose weight. She feels like her appetite is all over the place. She had gotten down to 160 over a few weeks and then gained the weight back. She tries to eat fruit and lean meat.  She is interested in medication to help with weight loss.    ROS See pertinent positives and negatives per HPI.    Objective:     BP 120/88 (BP Location: Left Arm)   Pulse 90   Temp (!) 97 F (36.1 C)   Ht 5\' 1"  (1.549 m)   Wt 172 lb (78 kg)   LMP 08/25/2022 (Exact Date)   SpO2 96%   BMI 32.50 kg/m    Physical Exam Vitals and nursing note reviewed.  Constitutional:      General: She is not in acute distress.    Appearance: Normal appearance.  HENT:     Head: Normocephalic.  Eyes:     Conjunctiva/sclera: Conjunctivae normal.  Cardiovascular:     Rate and Rhythm: Normal rate and regular rhythm.     Pulses: Normal pulses.     Heart sounds: Normal heart sounds.  Pulmonary:     Effort: Pulmonary effort is normal.     Breath sounds: Normal breath sounds.  Musculoskeletal:     Cervical back: Normal range of motion.  Skin:    General: Skin is warm.  Neurological:     General: No focal deficit present.     Mental Status: She is alert and oriented to person, place, and time.  Psychiatric:        Mood and Affect: Mood normal.        Behavior: Behavior normal.        Thought Content: Thought content normal.        Judgment:  Judgment normal.       Assessment & Plan:   Problem List Items Addressed This Visit       Musculoskeletal and Integument   Seborrheic dermatitis - Primary    Chronic, stable.  Continue ketoconazole shampoo and fluocinolone solution twice a day as needed.  Refill sent to the pharmacy.        Other   Obesity (BMI 30-39.9)    BMI 32.5.  She has been exercising and trying to limit portions.  She is also been try to focus on fruits and lean proteins.  She did lose about 12 pounds, however gained it back again.  She is interested in starting medication.  Will start Zepbound 2.5 mg injection weekly.  Discussed possible side effects.  Continue focusing on nutrition and exercise.  Follow-up in 6 to 8 weeks.      Relevant Medications   tirzepatide (ZEPBOUND) 2.5 MG/0.5ML Pen   Anxiety and depression    Chronic, stable.  She has stopped the duloxetine since it was causing nausea.  She states that her symptoms are overall well-controlled and would not like to restart  a different medication.  Follow-up with any concerns.       Return in about 6 weeks (around 10/13/2022) for 6-8 weeks weight management .    Gerre Scull, NP

## 2022-09-01 NOTE — Assessment & Plan Note (Signed)
BMI 32.5.  She has been exercising and trying to limit portions.  She is also been try to focus on fruits and lean proteins.  She did lose about 12 pounds, however gained it back again.  She is interested in starting medication.  Will start Zepbound 2.5 mg injection weekly.  Discussed possible side effects.  Continue focusing on nutrition and exercise.  Follow-up in 6 to 8 weeks.

## 2022-09-01 NOTE — Assessment & Plan Note (Signed)
Chronic, stable.  She has stopped the duloxetine since it was causing nausea.  She states that her symptoms are overall well-controlled and would not like to restart a different medication.  Follow-up with any concerns.

## 2022-09-01 NOTE — Patient Instructions (Signed)
It was great to see you!  Start zepbound injection once a week.  Make sure you drink plenty of water.   You can try a B12 supplement to help with fatigue.   Let's follow-up in 6-8 weeks, sooner if you have concerns.  If a referral was placed today, you will be contacted for an appointment. Please note that routine referrals can sometimes take up to 3-4 weeks to process. Please call our office if you haven't heard anything after this time frame.  Take care,  Rodman Pickle, NP

## 2022-09-01 NOTE — Assessment & Plan Note (Signed)
Chronic, stable.  Continue ketoconazole shampoo and fluocinolone solution twice a day as needed.  Refill sent to the pharmacy.

## 2022-09-03 ENCOUNTER — Encounter: Payer: Self-pay | Admitting: Nurse Practitioner

## 2022-09-03 ENCOUNTER — Encounter: Payer: Self-pay | Admitting: Neurology

## 2022-09-04 ENCOUNTER — Telehealth: Payer: Self-pay

## 2022-09-04 ENCOUNTER — Other Ambulatory Visit (HOSPITAL_COMMUNITY): Payer: Self-pay

## 2022-09-04 MED ORDER — FLUOCINONIDE 0.05 % EX SOLN: 1.0000 | Freq: Two times a day (BID) | CUTANEOUS | 1 refills | Status: AC

## 2022-09-04 NOTE — Telephone Encounter (Signed)
LVM that Rx of Zepbound approved

## 2022-09-04 NOTE — Telephone Encounter (Signed)
Pharmacy Patient Advocate Encounter   Received notification from Pt Calls Messages that prior authorization for Zepbound 2.5mg /0.64ml is required/requested.   Insurance verification completed.   The patient is insured through [facility] .   Per test claim: PA required; PA submitted to [facility] via CoverMyMeds Key/confirmation #/[id number] Status is pending

## 2022-09-04 NOTE — Telephone Encounter (Signed)
Pharmacy Patient Advocate Encounter  Received notification from [facility] that Prior Authorization for Zepbound 2.5mg /0.59ml has been APPROVED from 09/04/22 to 05/02/23   PA #/Case ID/Reference #: [id number]

## 2022-09-13 ENCOUNTER — Ambulatory Visit: Payer: No Typology Code available for payment source | Admitting: Neurology

## 2022-09-13 DIAGNOSIS — G4733 Obstructive sleep apnea (adult) (pediatric): Secondary | ICD-10-CM

## 2022-09-13 DIAGNOSIS — R419 Unspecified symptoms and signs involving cognitive functions and awareness: Secondary | ICD-10-CM

## 2022-09-13 DIAGNOSIS — E66811 Obesity, class 1: Secondary | ICD-10-CM

## 2022-09-13 DIAGNOSIS — R351 Nocturia: Secondary | ICD-10-CM

## 2022-09-13 DIAGNOSIS — G4719 Other hypersomnia: Secondary | ICD-10-CM

## 2022-09-13 DIAGNOSIS — R0683 Snoring: Secondary | ICD-10-CM

## 2022-09-13 DIAGNOSIS — Z9189 Other specified personal risk factors, not elsewhere classified: Secondary | ICD-10-CM

## 2022-09-13 DIAGNOSIS — R635 Abnormal weight gain: Secondary | ICD-10-CM

## 2022-09-13 DIAGNOSIS — R519 Headache, unspecified: Secondary | ICD-10-CM

## 2022-09-13 DIAGNOSIS — E669 Obesity, unspecified: Secondary | ICD-10-CM

## 2022-09-19 NOTE — Telephone Encounter (Signed)
Pt called stating she still doesn't have Zepbound. Provided key code and asked pt to contact her pharmacy.

## 2022-09-20 ENCOUNTER — Telehealth: Payer: Self-pay

## 2022-09-20 NOTE — Telephone Encounter (Signed)
Spoke to patient about the sleep lab receiving the results of the mail out home sleep study. Report has been generated for the MD to read. Once it is, pt will receive a call to go over the results. Pt was also instructed to now throw away the HST device.

## 2022-09-21 NOTE — Procedures (Signed)
   [facility]  HOME SLEEP TEST (Watch PAT) REPORT - Mail-out Device  STUDY DATE: 09/14/2022  DOB: May 25, 1990  MRN: [id number]  ORDERING CLINICIAN: Huston Foley, MD, PhD   REFERRING CLINICIAN: Gerre Scull, NP   CLINICAL INFORMATION/HISTORY: 32 year old female with an underlying medical history of anxiety, depression, ovarian cyst, hypertriglyceridemia, and obesity, who reports snoring and excessive daytime somnolence as well as recurrent headaches and nonrestorative sleep.   Epworth sleepiness score: 17/24.  BMI: 32.5 kg/m  FINDINGS:   Sleep Summary:   Total Recording Time (hours, min): 7 hours, 49 min  Total Sleep Time (hours, min):  7 hours, 7 min  Percent REM (%):    28.9%   Respiratory Indices:   Calculated pAHI (per hour):  7.9/hour         REM pAHI:    9.4/hour       NREM pAHI: 7.2/hour  Central pAHI: 0.9/hour  Oxygen Saturation Statistics:    Oxygen Saturation (%) Mean: 95%   Minimum oxygen saturation (%):                 89%   O2 Saturation Range (%): 89-98%    O2 Saturation (minutes) <=88%: 0 min  Pulse Rate Statistics:   Pulse Mean (bpm):    90/min    Pulse Range (65-109/min)   IMPRESSION: OSA (obstructive sleep apnea), mild  RECOMMENDATION:  This home sleep test demonstrates overall mild obstructive sleep apnea with a total AHI of 7.9/hour and O2 nadir of 89%. Snoring was detected, and appeared to be intermittent in the mild range. Given the patient's medical history and sleep related complaints, therapy with a  positive airway pressure device is recommended. Treatment can be achieved in the form of autoPAP trial/titration at home for now. A full night, in-lab PAP titration study may aid in improving proper treatment settings and with mask fit, if needed, down the road. Alternative treatments may include weight loss (where appropriate) along with avoidance of the supine sleep position (if possible), or an oral appliance in  appropriate candidates.   Please note that untreated obstructive sleep apnea may carry additional perioperative morbidity. Patients with significant obstructive sleep apnea should receive perioperative PAP therapy and the surgeons and particularly the anesthesiologist should be informed of the diagnosis and the severity of the sleep disordered breathing. The patient should be cautioned not to drive, work at heights, or operate dangerous or heavy equipment when tired or sleepy. Review and reiteration of good sleep hygiene measures should be pursued with any patient. Other causes of the patient's symptoms, including circadian rhythm disturbances, an underlying mood disorder, medication effect and/or an underlying medical problem cannot be ruled out based on this test. Clinical correlation is recommended.  The patient and her referring provider will be notified of the test results. The patient will be seen in follow up in sleep clinic at [facility], as necessary.  I certify that I have reviewed the raw data recording prior to the issuance of this report in accordance with the standards of the American Academy of Sleep Medicine (AASM).    INTERPRETING PHYSICIAN:   Huston Foley, MD, PhD Medical Director, [facility]) Diplomat, ABPN (Neurology and Sleep)   [facility] [address] [place], [place] [address] [phone]

## 2022-09-21 NOTE — Addendum Note (Signed)
Addended by: Huston Foley on: 09/21/2022 12:44 PM   Modules accepted: Orders

## 2022-09-21 NOTE — Telephone Encounter (Addendum)
Bobbye Morton, CMA  Zott, Kennyth Arnold; Gamble, Tammy New orders have been placed for the above pt, DOB: 1990/04/11 Thanks  Zott, Stacy  Javious Hallisey, Abbe Amsterdam, CMA; Melvern Sample Got It Thank You

## 2022-10-17 ENCOUNTER — Telehealth (INDEPENDENT_AMBULATORY_CARE_PROVIDER_SITE_OTHER): Payer: No Typology Code available for payment source | Admitting: Nurse Practitioner

## 2022-10-17 ENCOUNTER — Encounter: Payer: Self-pay | Admitting: Nurse Practitioner

## 2022-10-17 VITALS — Wt 169.0 lb

## 2022-10-17 DIAGNOSIS — E669 Obesity, unspecified: Secondary | ICD-10-CM

## 2022-10-17 DIAGNOSIS — Z6831 Body mass index (BMI) 31.0-31.9, adult: Secondary | ICD-10-CM

## 2022-10-17 DIAGNOSIS — G4733 Obstructive sleep apnea (adult) (pediatric): Secondary | ICD-10-CM | POA: Insufficient documentation

## 2022-10-17 MED ORDER — ZEPBOUND 5 MG/0.5ML ~~LOC~~ SOAJ: 5.0000 mg | SUBCUTANEOUS | 1 refills | Status: DC

## 2022-10-17 NOTE — Assessment & Plan Note (Signed)
She is tolerating the Zepbound injections well.  She is not having any side effects.  She notes that she has changed her diet, is eating more fruit and decreasing her portion sizes.  She has also been starting to walk more frequently.  Will increase her Zepbound to 5 mg injection weekly.  She has lost 3 pounds since her last visit, however this is according to her home scale.  Follow-up in 2 to 3 months.

## 2022-10-17 NOTE — Assessment & Plan Note (Signed)
She was recently diagnosed with mild sleep apnea.  She is working with her insurance and getting a CPAP machine.  Continue collaboration recommendations from sleep medicine.

## 2022-10-17 NOTE — Patient Instructions (Signed)
It was great to see you!  I have increased your zepbound to 5mg  injection weekly.  Continue with the nutrition and exercise changes you have been doing.   Let's follow-up in 2-3 months, sooner if you have concerns.  If a referral was placed today, you will be contacted for an appointment. Please note that routine referrals can sometimes take up to 3-4 weeks to process. Please call our office if you haven't heard anything after this time frame.  Take care,  Rodman Pickle, NP

## 2022-10-17 NOTE — Progress Notes (Signed)
[facility] [address] [place] [address] Dept: [phone] Dept Fax: [phone]  Virtual Video Visit  I connected with Alyssa Olson on 10/17/22 at 11:00 AM EDT by a video enabled telemedicine application and verified that I am speaking with the correct person using two identifiers.  Location patient: Home Location provider: Clinic Persons participating in the virtual visit: Patient; Rodman Pickle, NP; Malena Peer, CMA  I discussed the limitations of evaluation and management by telemedicine and the availability of in person appointments. The patient expressed understanding and agreed to proceed.  Chief Complaint  Patient presents with   Medication Management    Follow up with taking Zepbound    SUBJECTIVE:  HPI: Alyssa Olson is a 32 y.o. female who to follow-up on weight management.   She states that she has been having a decreased appetite. She has changed how she is eating and eating more fruit, however she has not lost any weight. She has also been walking during football practice. She denies side effects to the zepbound and would like to increase the dose.   She also had a sleep study which showed mild sleep apnea. They recommend a CPAP and she is working with her insurance on getting it.    Patient Active Problem List   Diagnosis Date Noted   OSA (obstructive sleep apnea) 10/17/2022   Seborrheic dermatitis 09/01/2022   Recurrent vaginitis 09/28/2021   Infertility counseling 09/28/2021   Wrist tendonitis 07/21/2021   Menorrhagia with regular cycle 07/21/2021   Elevated blood pressure reading 07/21/2021   Acute bilateral ankle pain 07/21/2021   Anxiety and depression 07/21/2021   Ovarian cyst 06/17/2018   Hypertriglyceridemia 05/21/2015   Obesity (BMI 30-39.9) 11/09/2014    Past Surgical History:  Procedure Laterality Date   APPENDECTOMY     LAPAROSCOPIC APPENDECTOMY N/A  08/05/2016   Procedure: APPENDECTOMY LAPAROSCOPIC;  Surgeon: Henrene Dodge, MD;  Location: [facility] ORS;  Service: General;  Laterality: N/A;    Family History  Problem Relation Age of Onset   Healthy Mother    Healthy Father    Vision loss Maternal Grandmother    Cancer Maternal Grandfather        colon   Hypertension Maternal Grandfather    Arthritis Maternal Grandfather    Diabetes Maternal Grandfather     Social History   Tobacco Use   Smoking status: Former    Types: Cigars   Smokeless tobacco: Never  Vaping Use   Vaping status: Never Used  Substance Use Topics   Alcohol use: Yes    Alcohol/week: 10.0 standard drinks of alcohol    Types: 10 Shots of liquor per week    Comment: socially   Drug use: No     Current Outpatient Medications:    tirzepatide (ZEPBOUND) 5 MG/0.5ML Pen, Inject 5 mg into the skin once a week., Disp: 2 mL, Rfl: 1   fluocinonide (LIDEX) 0.05 % external solution, Apply 1 Application topically 2 (two) times daily., Disp: 60 mL, Rfl: 1   ibuprofen (ADVIL) 800 MG tablet, Take 1 tablet (800 mg total) by mouth every 8 (eight) hours as needed for mild pain., Disp: 30 tablet, Rfl: 1   ketoconazole (NIZORAL) 2 % cream, Apply 1 Application topically daily. (Patient not taking: Reported on 07/12/2022), Disp: , Rfl:    meloxicam (MOBIC) 15 MG tablet, Take 1 tablet (15 mg total) by mouth daily., Disp: 30 tablet, Rfl: 0   triamcinolone cream (KENALOG)  0.1 %, Apply 1 application topically 2 (two) times daily. (Patient not taking: Reported on 07/12/2022), Disp: 30 g, Rfl: 0  No Known Allergies  ROS: See pertinent positives and negatives per HPI.  OBSERVATIONS/OBJECTIVE:  VITALS per patient if applicable: Today's Vitals   10/17/22 1110  Weight: 169 lb (76.7 kg)   Body mass index is 31.93 kg/m.    GENERAL: Alert and oriented. Appears well and in no acute distress.  HEENT: Atraumatic. Conjunctiva clear. No obvious abnormalities on inspection of external nose  and ears.  NECK: Normal movements of the head and neck.  LUNGS: On inspection, no signs of respiratory distress. Breathing rate appears normal. No obvious gross SOB, gasping or wheezing, and no conversational dyspnea.  CV: No obvious cyanosis.  MS: Moves all visible extremities without noticeable abnormality.  PSYCH/NEURO: Pleasant and cooperative. No obvious depression or anxiety. Speech and thought processing grossly intact.  ASSESSMENT AND PLAN:  Problem List Items Addressed This Visit       Respiratory   OSA (obstructive sleep apnea) - Primary    She was recently diagnosed with mild sleep apnea.  She is working with her insurance and getting a CPAP machine.  Continue collaboration recommendations from sleep medicine.        Other   Obesity (BMI 30-39.9)    She is tolerating the Zepbound injections well.  She is not having any side effects.  She notes that she has changed her diet, is eating more fruit and decreasing her portion sizes.  She has also been starting to walk more frequently.  Will increase her Zepbound to 5 mg injection weekly.  She has lost 3 pounds since her last visit, however this is according to her home scale.  Follow-up in 2 to 3 months.      Relevant Medications   tirzepatide (ZEPBOUND) 5 MG/0.5ML Pen     I discussed the assessment and treatment plan with the patient. The patient was provided an opportunity to ask questions and all were answered. The patient agreed with the plan and demonstrated an understanding of the instructions.   The patient was advised to call back or seek an in-person evaluation if the symptoms worsen or if the condition fails to improve as anticipated.   Gerre Scull, NP

## 2022-11-16 ENCOUNTER — Other Ambulatory Visit: Payer: Self-pay | Admitting: Nurse Practitioner

## 2022-11-16 NOTE — Telephone Encounter (Signed)
Requesting: IBUPROFEN 800MG  TABLETS  Last Visit: 10/17/2022 Next Visit: Visit date not found Last Refill: 09/01/2022  Please Advise

## 2022-11-23 ENCOUNTER — Other Ambulatory Visit: Payer: Self-pay | Admitting: Nurse Practitioner

## 2022-11-24 NOTE — Telephone Encounter (Signed)
Please see refill request below. Was last sent in on 11-16-2022 for qty of 30.

## 2022-11-24 NOTE — Telephone Encounter (Signed)
Requesting: IBUPROFEN 800MG  TABLETS  Last Visit: 09/01/2022 Next Visit: Visit date not found Last Refill: 11/16/2022  Please Advise

## 2022-11-27 ENCOUNTER — Other Ambulatory Visit: Payer: Self-pay

## 2022-11-27 ENCOUNTER — Emergency Department
Admission: EM | Admit: 2022-11-27 | Discharge: 2022-11-27 | Disposition: A | Discharge status: Home / Self Care | Payer: No Typology Code available for payment source | Attending: Emergency Medicine | Admitting: Emergency Medicine

## 2022-11-27 DIAGNOSIS — K529 Noninfective gastroenteritis and colitis, unspecified: Secondary | ICD-10-CM | POA: Diagnosis not present

## 2022-11-27 DIAGNOSIS — R109 Unspecified abdominal pain: Secondary | ICD-10-CM | POA: Diagnosis present

## 2022-11-27 LAB — URINALYSIS, ROUTINE W REFLEX MICROSCOPIC
Bilirubin Urine: NEGATIVE
Glucose, UA: NEGATIVE mg/dL
Hgb urine dipstick: NEGATIVE
Ketones, ur: NEGATIVE mg/dL
Nitrite: NEGATIVE
Protein, ur: NEGATIVE mg/dL
Specific Gravity, Urine: 1.017 (ref 1.005–1.030)
pH: 6 (ref 5.0–8.0)

## 2022-11-27 LAB — COMPREHENSIVE METABOLIC PANEL
ALT: 17 U/L (ref 0–44)
AST: 18 U/L (ref 15–41)
Albumin: 4.5 g/dL (ref 3.5–5.0)
Alkaline Phosphatase: 51 U/L (ref 38–126)
Anion gap: 6 (ref 5–15)
BUN: 12 mg/dL (ref 6–20)
CO2: 22 mmol/L (ref 22–32)
Calcium: 8.5 mg/dL — ABNORMAL LOW (ref 8.9–10.3)
Chloride: 107 mmol/L (ref 98–111)
Creatinine, Ser: 0.76 mg/dL (ref 0.44–1.00)
GFR, Estimated: >60 mL/min (ref >60)
Glucose, Bld: 99 mg/dL (ref 70–99)
Potassium: 3.6 mmol/L (ref 3.5–5.1)
Sodium: 135 mmol/L (ref 135–145)
Total Bilirubin: 0.3 mg/dL (ref <1.2)
Total Protein: 7.8 g/dL (ref 6.5–8.1)

## 2022-11-27 LAB — CBC
HCT: 42.9 % (ref 36.0–46.0)
Hemoglobin: 14.9 g/dL (ref 12.0–15.0)
MCH: 29.9 pg (ref 26.0–34.0)
MCHC: 34.7 g/dL (ref 30.0–36.0)
MCV: 86.1 fL (ref 80.0–100.0)
Platelets: 381 10*3/uL (ref 150–400)
RBC: 4.98 MIL/uL (ref 3.87–5.11)
RDW: 12.2 % (ref 11.5–15.5)
WBC: 9.5 10*3/uL (ref 4.0–10.5)
nRBC: 0 % (ref 0.0–0.2)

## 2022-11-27 LAB — POC URINE PREG, ED: Preg Test, Ur: NEGATIVE

## 2022-11-27 LAB — LIPASE, BLOOD: Lipase: 36 U/L (ref 11–51)

## 2022-11-27 MED ORDER — LOPERAMIDE HCL 2 MG PO CAPS
4.0000 mg | ORAL_CAPSULE | Freq: Once | ORAL | Status: AC
  Administered 2022-11-27: 4 mg via ORAL
  Filled 2022-11-27: qty 2

## 2022-11-27 MED ORDER — LOPERAMIDE HCL 2 MG PO TABS: 2.0000 mg | ORAL_TABLET | Freq: Four times a day (QID) | ORAL | 0 refills | Status: AC | PRN

## 2022-11-27 MED ORDER — ONDANSETRON HCL 4 MG/2ML IJ SOLN
4.0000 mg | Freq: Once | INTRAMUSCULAR | Status: AC
  Administered 2022-11-27: 4 mg via INTRAVENOUS
  Filled 2022-11-27: qty 2

## 2022-11-27 MED ORDER — ONDANSETRON 4 MG PO TBDP: 4.0000 mg | ORAL_TABLET | Freq: Three times a day (TID) | ORAL | 0 refills | Status: AC | PRN

## 2022-11-27 MED ORDER — KETOROLAC TROMETHAMINE 30 MG/ML IJ SOLN
15.0000 mg | Freq: Once | INTRAMUSCULAR | Status: AC
  Administered 2022-11-27: 15 mg via INTRAVENOUS
  Filled 2022-11-27: qty 1

## 2022-11-27 MED ORDER — SODIUM CHLORIDE 0.9 % IV BOLUS
1000.0000 mL | Freq: Once | INTRAVENOUS | Status: AC
  Administered 2022-11-27: 1000 mL via INTRAVENOUS

## 2022-11-27 NOTE — ED Notes (Signed)
 Patient discharged from ED by provider. Discharge instructions reviewed with patient and all questions answered. Patient ambulatory from ED in NAD.

## 2022-11-27 NOTE — ED Triage Notes (Signed)
Pt comes with c/o vomiting ,diarrhea and belly pain for three days.

## 2022-11-27 NOTE — ED Provider Notes (Signed)
[facility] Provider Note    Event Date/Time   First MD Initiated Contact with Patient 11/27/22 2008     (approximate)  History   Chief Complaint: Abdominal Pain  HPI  Alyssa Olson is a 32 y.o. female with a past medical history of anxiety, presents to the emergency department for nausea vomiting and diarrhea.  According to the patient approximately 3 days ago she ate seafood, later that night she awoke to nausea vomiting followed by diarrhea.  Patient states she has been nauseated with occasional vomiting over the weekend with continued diarrhea.  Patient states given her ongoing symptoms she came to the emergency department for evaluation.  Patient describes abdominal cramping but denies any focal area of pain.  No fever.  Physical Exam   Triage Vital Signs: ED Triage Vitals  Encounter Vitals Group     BP 11/27/22 1715 130/89     Systolic BP Percentile --      Diastolic BP Percentile --      Pulse Rate 11/27/22 1715 (!) 111     Resp 11/27/22 1715 17     Temp 11/27/22 1715 98.4 F (36.9 C)     Temp Source 11/27/22 1715 Oral     SpO2 11/27/22 1715 96 %     Weight 11/27/22 1713 167 lb (75.8 kg)     Height 11/27/22 1713 5\' 1"  (1.549 m)     Head Circumference --      Peak Flow --      Pain Score 11/27/22 1713 6     Pain Loc --      Pain Education --      Exclude from Growth Chart --     Most recent vital signs: Vitals:   11/27/22 1715  BP: 130/89  Pulse: (!) 111  Resp: 17  Temp: 98.4 F (36.9 C)  SpO2: 96%    General: Awake, no distress.  CV:  Good peripheral perfusion.  Regular rate and rhythm  Resp:  Normal effort.  Equal breath sounds bilaterally.  Abd:  No distention.  Soft, nontender.  No rebound or guarding.  ED Results / Procedures / Treatments   MEDICATIONS ORDERED IN ED: Medications  sodium chloride 0.9 % bolus 1,000 mL (has no administration in time range)  ondansetron (ZOFRAN) injection 4 mg (has no administration  in time range)  ketorolac (TORADOL) 30 MG/ML injection 15 mg (has no administration in time range)  loperamide (IMODIUM) capsule 4 mg (has no administration in time range)     IMPRESSION / MDM / ASSESSMENT AND PLAN / ED COURSE  I reviewed the triage vital signs and the nursing notes.  Patient's presentation is most consistent with acute presentation with potential threat to life or bodily function.  Patient presents emergency department for nausea vomiting diarrhea over the past 3 days.  Patient states symptoms started approximately 6 hours or so after eating seafood at a restaurant.  Patient describes abdominal cramping has mild diffuse tenderness.  Patient's symptoms are very suggestive of gastroenteritis.  Reassuringly the patient's lab work has resulted showing a normal urinalysis, negative pregnancy test.  Normal CBC with a normal white blood cell count, reassuring chemistry with normal LFTs, normal lipase.  Will treat with Toradol Zofran fluids and loperamide and reassess.  Patient states she is feeling much better after medications and fluids.  Will discharge with Zofran and loperamide have the patient follow-up with her doctor.  Discussed supportive care at home.  Provided my typical  return precautions.  FINAL CLINICAL IMPRESSION(S) / ED DIAGNOSES   Gastroenteritis  Rx / DC Orders   Zofran  Note:  This document was prepared using Dragon voice recognition software and may include unintentional dictation errors.   Minna Antis, MD 11/27/22 2219

## 2022-11-28 ENCOUNTER — Telehealth: Payer: Self-pay

## 2022-11-28 NOTE — Transitions of Care (Post Inpatient/ED Visit) (Unsigned)
   11/28/2022  Name: Alyssa Olson MRN: [id number] DOB: December 24, 1990  Today's TOC FU Call Status: Today's TOC FU Call Status:: Unsuccessful Call (1st Attempt) Unsuccessful Call (1st Attempt) Date: 11/28/22  Attempted to reach the patient regarding the most recent Inpatient/ED visit.  Follow Up Plan: Additional outreach attempts will be made to reach the patient to complete the Transitions of Care (Post Inpatient/ED visit) call.   Signature Arvil Persons, BSN, Charity fundraiser

## 2022-11-29 ENCOUNTER — Other Ambulatory Visit: Payer: Self-pay

## 2022-11-29 ENCOUNTER — Emergency Department: Payer: No Typology Code available for payment source

## 2022-11-29 ENCOUNTER — Emergency Department
Admission: EM | Admit: 2022-11-29 | Discharge: 2022-11-29 | Disposition: A | Discharge status: Home / Self Care | Payer: No Typology Code available for payment source | Attending: Emergency Medicine | Admitting: Emergency Medicine

## 2022-11-29 DIAGNOSIS — R000 Tachycardia, unspecified: Secondary | ICD-10-CM | POA: Diagnosis not present

## 2022-11-29 DIAGNOSIS — E876 Hypokalemia: Secondary | ICD-10-CM | POA: Insufficient documentation

## 2022-11-29 DIAGNOSIS — K529 Noninfective gastroenteritis and colitis, unspecified: Secondary | ICD-10-CM | POA: Diagnosis not present

## 2022-11-29 DIAGNOSIS — R1010 Upper abdominal pain, unspecified: Secondary | ICD-10-CM | POA: Diagnosis present

## 2022-11-29 DIAGNOSIS — D72829 Elevated white blood cell count, unspecified: Secondary | ICD-10-CM | POA: Insufficient documentation

## 2022-11-29 DIAGNOSIS — R1084 Generalized abdominal pain: Secondary | ICD-10-CM

## 2022-11-29 LAB — COMPREHENSIVE METABOLIC PANEL
ALT: 16 U/L (ref 0–44)
AST: 18 U/L (ref 15–41)
Albumin: 4.1 g/dL (ref 3.5–5.0)
Alkaline Phosphatase: 44 U/L (ref 38–126)
Anion gap: 10 (ref 5–15)
BUN: 9 mg/dL (ref 6–20)
CO2: 21 mmol/L — ABNORMAL LOW (ref 22–32)
Calcium: 8.1 mg/dL — ABNORMAL LOW (ref 8.9–10.3)
Chloride: 106 mmol/L (ref 98–111)
Creatinine, Ser: 0.8 mg/dL (ref 0.44–1.00)
GFR, Estimated: >60 mL/min (ref >60)
Glucose, Bld: 105 mg/dL — ABNORMAL HIGH (ref 70–99)
Potassium: 3.3 mmol/L — ABNORMAL LOW (ref 3.5–5.1)
Sodium: 137 mmol/L (ref 135–145)
Total Bilirubin: 0.3 mg/dL (ref <1.2)
Total Protein: 7 g/dL (ref 6.5–8.1)

## 2022-11-29 LAB — CBC
HCT: 44.9 % (ref 36.0–46.0)
Hemoglobin: 15.6 g/dL — ABNORMAL HIGH (ref 12.0–15.0)
MCH: 29.7 pg (ref 26.0–34.0)
MCHC: 34.7 g/dL (ref 30.0–36.0)
MCV: 85.5 fL (ref 80.0–100.0)
Platelets: 393 10*3/uL (ref 150–400)
RBC: 5.25 MIL/uL — ABNORMAL HIGH (ref 3.87–5.11)
RDW: 12.1 % (ref 11.5–15.5)
WBC: 16.6 10*3/uL — ABNORMAL HIGH (ref 4.0–10.5)
nRBC: 0 % (ref 0.0–0.2)

## 2022-11-29 LAB — LIPASE, BLOOD: Lipase: 34 U/L (ref 11–51)

## 2022-11-29 MED ORDER — AZITHROMYCIN 500 MG PO TABS: 500.0000 mg | ORAL_TABLET | Freq: Every day | ORAL | 0 refills | Status: AC

## 2022-11-29 MED ORDER — LACTATED RINGERS IV BOLUS
1000.0000 mL | Freq: Once | INTRAVENOUS | Status: AC
Start: 2022-11-29 — End: 2022-11-29
  Administered 2022-11-29: 1000 mL via INTRAVENOUS

## 2022-11-29 MED ORDER — IOHEXOL 300 MG/ML  SOLN
100.0000 mL | Freq: Once | INTRAMUSCULAR | Status: AC | PRN
  Administered 2022-11-29: 100 mL via INTRAVENOUS

## 2022-11-29 MED ORDER — ONDANSETRON HCL 4 MG/2ML IJ SOLN
4.0000 mg | INTRAMUSCULAR | Status: AC
Start: 2022-11-29 — End: 2022-11-29
  Administered 2022-11-29: 4 mg via INTRAVENOUS
  Filled 2022-11-29: qty 2

## 2022-11-29 MED ORDER — KETOROLAC TROMETHAMINE 30 MG/ML IJ SOLN
15.0000 mg | Freq: Once | INTRAMUSCULAR | Status: AC
Start: 2022-11-29 — End: 2022-11-29
  Administered 2022-11-29: 15 mg via INTRAVENOUS
  Filled 2022-11-29: qty 1

## 2022-11-29 MED ORDER — MORPHINE SULFATE (PF) 4 MG/ML IV SOLN
4.0000 mg | Freq: Once | INTRAVENOUS | Status: AC
  Administered 2022-11-29: 4 mg via INTRAVENOUS
  Filled 2022-11-29: qty 1

## 2022-11-29 NOTE — ED Triage Notes (Signed)
Pt reports abd pain n/v x3 days, pt was seen last night for same but states it is worse tonight

## 2022-11-29 NOTE — ED Provider Notes (Signed)
[facility] Provider Note    Event Date/Time   First MD Initiated Contact with Patient 11/29/22 0158     (approximate)   History   Abdominal Pain   HPI Alyssa Olson is a 32 y.o. female who presents for persistent abdominal pain as well as nausea, vomiting, and increased stooling.  She has had symptoms for about 5 or more days.  She was seen here 1 to 2 days ago and her workup was reassuring and she was discharged.  However she said that the pain is worse tonight, primarily in her upper abdomen and radiating through to her back.  She still has nausea and vomiting.  She normally does not have bowel movements regularly but has been going to the bathroom about 6 times a day for the last couple of days which is very unusual for her.  No dysuria and no fever.     Physical Exam   Triage Vital Signs: ED Triage Vitals  Encounter Vitals Group     BP 11/29/22 0042 120/80     Systolic BP Percentile --      Diastolic BP Percentile --      Pulse Rate 11/29/22 0042 (!) 108     Resp 11/29/22 0042 16     Temp 11/29/22 0042 97.6 F (36.4 C)     Temp Source 11/29/22 0042 Oral     SpO2 11/29/22 0042 100 %     Weight 11/29/22 0041 75.8 kg (167 lb)     Height 11/29/22 0041 1.549 m (5\' 1" )     Head Circumference --      Peak Flow --      Pain Score 11/29/22 0309 Asleep     Pain Loc --      Pain Education --      Exclude from Growth Chart --     Most recent vital signs: Vitals:   11/29/22 0042 11/29/22 0525  BP: 120/80 (!) 89/57  Pulse: (!) 108 83  Resp: 16 16  Temp: 97.6 F (36.4 C) 97.9 F (36.6 C)  SpO2: 100% 97%    General: Awake, appears uncomfortable but is not in severe distress. CV:  Good peripheral perfusion.  Mild tachycardia, regular rhythm. Resp:  Normal effort. Speaking easily and comfortably, no accessory muscle usage nor intercostal retractions.   Abd:  No distention.  Tender to palpation in the epigastrium and right upper quadrant  with guarding consistent with positive Murphy sign.  Mild lower abdominal tenderness but without guarding or rebound.   ED Results / Procedures / Treatments   Labs (all labs ordered are listed, but only abnormal results are displayed) Labs Reviewed  COMPREHENSIVE METABOLIC PANEL - Abnormal; Notable for the following components:      Result Value   Potassium 3.3 (*)    CO2 21 (*)    Glucose, Bld 105 (*)    Calcium 8.1 (*)    All other components within normal limits  CBC - Abnormal; Notable for the following components:   WBC 16.6 (*)    RBC 5.25 (*)    Hemoglobin 15.6 (*)    All other components within normal limits  LIPASE, BLOOD  POC URINE PREG, ED     RADIOLOGY I viewed and interpreted the patient's right upper quadrant ultrasound.  No acute or emergent findings.  I also viewed and interpreted the patient's CT of the abdomen and pelvis.  See hospital course for details, consistent with gastroenteritis without emergent finding.  PROCEDURES:  Critical Care performed: No  Procedures    IMPRESSION / MDM / ASSESSMENT AND PLAN / ED COURSE  I reviewed the triage vital signs and the nursing notes.                              Differential diagnosis includes, but is not limited to, gastroenteritis, colitis/enteritis, viral or bacterial GI pathogen, biliary colic/gallbladder disease, pancreatitis.  Patient's presentation is most consistent with acute presentation with potential threat to life or bodily function.  Labs/studies ordered: CMP, lipase, CBC, urine pregnancy, abdominal ultrasound  Interventions/Medications given:  Medications  lactated ringers bolus 1,000 mL (0 mLs Intravenous Stopped 11/29/22 0532)  ondansetron (ZOFRAN) injection 4 mg (4 mg Intravenous Given 11/29/22 0244)  morphine (PF) 4 MG/ML injection 4 mg (4 mg Intravenous Given 11/29/22 0246)  ketorolac (TORADOL) 30 MG/ML injection 15 mg (15 mg Intravenous Given 11/29/22 0245)  iohexol (OMNIPAQUE)  300 MG/ML solution 100 mL (100 mLs Intravenous Contrast Given 11/29/22 0449)    (Note:  hospital course my include additional interventions and/or labs/studies not listed above.)   Vital signs notable for some mild tachycardia and her labs are notable for some hemoconcentration.  I suspect she is volume depleted in the setting of decreased oral intake and increased output.  She has mild hypokalemia but not clinically significant.  She has a leukocytosis of 16.6 which she did not have on her last ED visit.  Based on her physical exam, I am most concerned for gallbladder disease.  She has significant upper abdominal (epigastric and right upper quadrant) tenderness but no peritonitis in the lower abdomen.  Will obtain an ultrasound, and if it is completely normal, I will likely proceed with a CT of the abdomen and pelvis.  Medications as listed above including 1 L IV fluids for her symptoms, and I will reassess after imaging results are available.   Clinical Course as of 11/29/22 0622  Wed Nov 29, 2022  4098 US ABDOMEN LIMITED RUQ (LIVER/GB) I viewed and interpreted the patient's ultrasound and I see no specific acute abnormality.  The radiologist commented on the presence of some fluid in Morison's pouch.  Given no specific etiology of the patient's ongoing pain and discomfort, as well as her new leukocytosis since her last ED visit, I ordered a CT of the abdomen pelvis for further evaluation. [CF]  0610 CT ABDOMEN PELVIS W CONTRAST I viewed and interpreted the patient's CT of the abdomen and pelvis.  There fluid contents in the colon suggestive of diarrheal illness but otherwise I see no evidence of emergent medical condition.  The radiologist confirmed evidence of gastroenteritis and reactive fluid in the abdomen but no evidence of acute infectious process or obstruction.   [CF]  [id number] I reassessed the patient.  She says she feels better.  She has stable vital signs and no pain at this time.  I  talked with her extensively about the course of illness.  She tells me she is taking one of the injectable weight loss medications and we talked about whether this could be causing her symptoms.  We agreed to treat her empirically with azithromycin 500 mg p.o. daily x 3 days given that her symptoms sound much like a " traveler's diarrhea" (with nausea and vomiting), and she understands that it may not make a difference.  She will also not take her next injection of weight loss medication.  She will follow-up with  her primary care doctor and will use the antiemetics previously prescribed to her.  I gave my usual and customary follow-up recommendations and return precautions. [CF]    Clinical Course User Index [CF] Loleta Rose, MD     FINAL CLINICAL IMPRESSION(S) / ED DIAGNOSES   Final diagnoses:  Gastroenteritis  Generalized abdominal pain     Rx / DC Orders   ED Discharge Orders          Ordered    azithromycin (ZITHROMAX) 500 MG tablet  Daily        11/29/22 0620             Note:  This document was prepared using Dragon voice recognition software and may include unintentional dictation errors.   Loleta Rose, MD 11/29/22 [phone]

## 2022-11-29 NOTE — Discharge Instructions (Signed)
Please take the medications you were previously prescribed, as well as the new prescription we gave you this morning.  You will take it once a day for 3 days and it may help with the symptoms you are experiencing.  Please try to eat and drink sufficiently to stay hydrated, and you may wish to avoid taking any of the weight loss medicine again until your symptoms have completely resolved.  Follow-up with your regular provider at the next available opportunity and return to the emergency department if you develop new or worsening symptoms that concern you.

## 2022-11-29 NOTE — Transitions of Care (Post Inpatient/ED Visit) (Signed)
   11/29/2022  Name: Alyssa Olson MRN: [id number] DOB: 1990/12/04  Today's TOC FU Call Status: Today's TOC FU Call Status:: Successful TOC FU Call Completed Unsuccessful Call (1st Attempt) Date: 11/28/22 [facility] FU Call Complete Date: 11/29/22 Patient's Name and Date of Birth confirmed.  Transition Care Management Follow-up Telephone Call Date of Discharge: 11/29/22 Discharge Facility: [facility]) Type of Discharge: Emergency Department How have you been since you were released from the hospital?: Same Any questions or concerns?: No  Items Reviewed: Did you receive and understand the discharge instructions provided?: Yes Medications obtained,verified, and reconciled?: No Any new allergies since your discharge?: No Dietary orders reviewed?: Yes Do you have support at home?: Yes  Medications Reviewed Today: Medications Reviewed Today   Medications were not reviewed in this encounter     Home Care and Equipment/Supplies: Were Home Health Services Ordered?: NA Any new equipment or medical supplies ordered?: NA  Functional Questionnaire: Do you need assistance with bathing/showering or dressing?: No Do you need assistance with meal preparation?: No Do you need assistance with eating?: No Do you have difficulty maintaining continence: No Do you need assistance with getting out of bed/getting out of a chair/moving?: No Do you have difficulty managing or taking your medications?: No  Follow up appointments reviewed: PCP Follow-up appointment confirmed?: NA Specialist Hospital Follow-up appointment confirmed?: NA Do you need transportation to your follow-up appointment?: No Do you understand care options if your condition(s) worsen?: Yes-patient verbalized understanding    SIGNATURE Arvil Persons, BSN, RN

## 2022-12-18 ENCOUNTER — Other Ambulatory Visit: Payer: Self-pay | Admitting: Nurse Practitioner

## 2023-05-11 ENCOUNTER — Other Ambulatory Visit (HOSPITAL_COMMUNITY): Payer: Self-pay

## 2023-05-11 ENCOUNTER — Telehealth: Payer: Self-pay

## 2023-05-11 NOTE — Telephone Encounter (Signed)
 Pharmacy Patient Advocate Encounter   Received notification from CoverMyMeds that prior authorization for Zepbound  2.5MG /0.5ML pen-injectors  is due for renewal.   Insurance verification completed.   The patient is insured through [facility].  Action: PA required; PA started via CoverMyMeds. KEY [id number] . Waiting for clinical questions to populate.

## 2023-05-11 NOTE — Telephone Encounter (Signed)
 Pharmacy Patient Advocate Encounter  Received notification from [employer] [facility] that Prior Authorization for Zepbound  2.5MG /0.5ML pen-injectors   PT MUST HAVE BEEN SEEN AND HAD A WEIGHT CHECK WITH 45 DAYS BEFORE PA CAN BE SENT TO PLAN   PLEASE BE ADVISED MUST BE DOCUMENTED WEIGHT GAIN OR LOST PER PLAN LAST VISIT DOCUMENTED WAS 09/01/2022 WITH WEIGHT AND BMI

## 2023-05-14 NOTE — Telephone Encounter (Signed)
Let message for patient to return call.

## 2023-05-17 ENCOUNTER — Other Ambulatory Visit: Payer: Self-pay | Admitting: Nurse Practitioner

## 2023-05-17 ENCOUNTER — Other Ambulatory Visit (HOSPITAL_COMMUNITY)
Admission: RE | Admit: 2023-05-17 | Discharge: 2023-05-17 | Disposition: A | Discharge status: Home / Self Care | Source: Ambulatory Visit | Attending: Obstetrics and Gynecology | Admitting: Obstetrics and Gynecology

## 2023-05-17 ENCOUNTER — Ambulatory Visit: Admitting: Obstetrics and Gynecology

## 2023-05-17 VITALS — BP 136/95 | HR 96 | Ht 61.0 in | Wt 172.0 lb

## 2023-05-17 DIAGNOSIS — Z01419 Encounter for gynecological examination (general) (routine) without abnormal findings: Secondary | ICD-10-CM

## 2023-05-17 DIAGNOSIS — Z124 Encounter for screening for malignant neoplasm of cervix: Secondary | ICD-10-CM | POA: Insufficient documentation

## 2023-05-17 NOTE — Telephone Encounter (Signed)
 Requesting: IBUPROFEN  800MG  TABLETS  Last Visit: 09/01/2022 Next Visit: Visit date not found Last Refill: 11/24/2022  Please Advise

## 2023-05-17 NOTE — Progress Notes (Signed)
 Obstetrics and Gynecology New Patient Evaluation  Appointment Date: 05/17/2023  OBGYN Clinic: [facility]   Primary Care Provider: Odette Benjamin  Referring Provider: Odette Benjamin, NP  Chief Complaint:  Chief Complaint  Patient presents with   Gynecologic Exam    History of Present Illness: Alyssa Olson is a 33 y.o.  G2P1011 (Patient's last menstrual period was 04/23/2023.), seen for the above chief complaint.   No complaints or issues today   Review of Systems: Pertinent items are noted in HPI.   Patient Active Problem List   Diagnosis Date Noted   OSA (obstructive sleep apnea) 10/17/2022   Seborrheic dermatitis 09/01/2022   Recurrent vaginitis 09/28/2021   Infertility counseling 09/28/2021   Wrist tendonitis 07/21/2021   Menorrhagia with regular cycle 07/21/2021   Elevated blood pressure reading 07/21/2021   Acute bilateral ankle pain 07/21/2021   Anxiety and depression 07/21/2021   Ovarian cyst 06/17/2018   Hypertriglyceridemia 05/21/2015   Obesity (BMI 30-39.9) 11/09/2014   Past Medical History:  Past Medical History:  Diagnosis Date   Acute appendicitis    Anxiety    Bacterial vaginosis    History of chlamydia    Ovarian cyst     Past Surgical History:  Past Surgical History:  Procedure Laterality Date   APPENDECTOMY     LAPAROSCOPIC APPENDECTOMY N/A 08/05/2016   Procedure: APPENDECTOMY LAPAROSCOPIC;  Surgeon: Emmalene Hare, MD;  Location: [facility] ORS;  Service: General;  Laterality: N/A;    Past Obstetrical History:  OB History  Gravida Para Term Preterm AB Living  2 1 1  0 1 1  SAB IAB Ectopic Multiple Live Births  0 1 0  1    # Outcome Date GA Lbr Len/2nd Weight Sex Type Anes PTL Lv  2 IAB 02/15/14 [redacted]w[redacted]d         1 Term 10/23/13   7 lb 10 oz (3.459 kg) M Vag-Spont  N LIV    Past Gynecological History: As per HPI. Periods: qmonth, regular, approximately 1wk, not heavy or painful History of Pap  Smear(s): Yes.   Last pap 2021, which was negative History of STI(s): Yes.   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  Other Topics Concern   Not on file  Social History Narrative   ** Merged History Encounter **    Caffiene; cup coffee daily   Working:  Clinical biochemist remote,  PT retail job   Social Drivers of Corporate investment banker Strain: Not on file  Food Insecurity: Not on file  Transportation Needs: Not on file  Physical Activity: Not on file  Stress: Not on file (02/26/2019)  Social Connections: Not on file  Intimate  Partner Violence: Low Risk  (05/03/2019)   Received [facility]   Intimate Partner Violence    Insults You: Not on file    Threatens You: Not on file    Screams at You: Not on file    Physically Hurt: Not on file    Intimate Partner Violence Score: Not on file    Family History:  Family History  Problem Relation Age of Onset   Healthy Mother    Healthy Father    Vision loss Maternal Grandmother    Cancer Maternal Grandfather        colon   Hypertension Maternal Grandfather    Arthritis Maternal Grandfather    Diabetes Maternal Grandfather    Medications Rayana L. Rogers-Leath had no medications administered during this visit. Current Outpatient Medications  Medication Sig  Dispense Refill   ibuprofen  (ADVIL ) 800 MG tablet TAKE 1 TABLET(800 MG) BY MOUTH EVERY 8 HOURS AS NEEDED. DO NOT TAKE WITH MELOXICAM  90 tablet 0   fluocinonide  (LIDEX ) 0.05 % external solution Apply 1 Application topically 2 (two) times daily. (Patient not taking: Reported on 05/17/2023) 60 mL 1   ketoconazole  (NIZORAL ) 2 % cream Apply 1 Application topically daily. (Patient not taking: Reported on 05/17/2023)     loperamide  (IMODIUM  A-D) 2 MG tablet Take 1 tablet (2 mg total) by mouth 4 (four) times daily as needed for diarrhea or loose stools. (Patient not taking: Reported on 05/17/2023) 20 tablet 0   meloxicam  (MOBIC ) 15 MG tablet Take 1 tablet (15 mg total) by mouth daily. (Patient not taking: Reported on 05/17/2023) 30 tablet 0   ondansetron  (ZOFRAN -ODT) 4 MG disintegrating tablet Take 1 tablet (4 mg total) by mouth every 8 (eight) hours as needed for nausea or vomiting. (Patient not taking: Reported on 05/17/2023) 20 tablet 0   triamcinolone  cream (KENALOG ) 0.1 % Apply 1 application topically 2 (two) times daily. (Patient not taking: Reported on 05/17/2023) 30 g 0   ZEPBOUND  5 MG/0.5ML Pen ADMINISTER 5 MG UNDER THE SKIN 1 TIME A WEEK 2 mL 1   No current facility-administered medications for this visit.    Allergies Patient has no known allergies.   Physical Exam:  BP (!) 136/95   Pulse 96   Ht 5\' 1"  (1.549 m)   Wt 172 lb (78 kg)   LMP 04/23/2023   BMI 32.50 kg/m  Body mass index is 32.5 kg/m. General appearance: Well nourished, well developed female in no acute distress.  Neck:  Supple, normal appearance, and no thyromegaly  Respiratory: Normal respiratory effort Abdomen: positive bowel sounds and no masses, hernias; diffusely non tender to palpation, non distended Breasts: no s/s. Neuro/Psych:  Normal mood and affect.  Skin:  Warm and dry.  Lymphatic:  No inguinal lymphadenopathy.   Cervical exam performed in the presence of a chaperone Pelvic exam: is not limited by body  habitus EGBUS: within normal limits Vagina: within normal limits and with no blood or discharge in the vault Cervix: normal appearing cervix without tenderness, discharge or lesions. Uterus:  nonenlarged and non tender Adnexa:  normal adnexa and no mass, fullness, tenderness Rectovaginal: deferred  Laboratory: none  Radiology: none  Assessment: patient doing well  Plan: 1. Cervical cancer screening (Primary) Declines blood STD screenign - Cytology - PAP  2. Well woman exam with routine gynecological exam  Tyler Gallant MD Attending [facility])

## 2023-05-17 NOTE — Progress Notes (Signed)
 Patient presents for Annual.  LMP: 04/23/23 monthly Last pap:  2021 [facility]  Contraception: None Mammogram: Not yet indicated STD Screening: Accepts   CC: Annual/None

## 2023-05-21 LAB — CYTOLOGY - PAP
Chlamydia: NEGATIVE
Comment: NEGATIVE
Comment: NEGATIVE
Comment: NEGATIVE
Comment: NORMAL
Diagnosis: NEGATIVE
High risk HPV: NEGATIVE
Neisseria Gonorrhea: NEGATIVE
Trichomonas: NEGATIVE

## 2023-05-24 ENCOUNTER — Encounter: Payer: Self-pay | Admitting: Obstetrics and Gynecology

## 2023-06-12 ENCOUNTER — Encounter: Payer: Self-pay | Admitting: Family Medicine

## 2023-06-12 ENCOUNTER — Ambulatory Visit: Payer: Self-pay | Admitting: Family Medicine

## 2023-06-12 ENCOUNTER — Ambulatory Visit: Payer: Self-pay

## 2023-06-12 ENCOUNTER — Ambulatory Visit: Admitting: Family Medicine

## 2023-06-12 VITALS — BP 124/82 | HR 91 | Temp 97.7°F | Ht 61.0 in | Wt 179.4 lb

## 2023-06-12 DIAGNOSIS — R609 Edema, unspecified: Secondary | ICD-10-CM

## 2023-06-12 LAB — SEDIMENTATION RATE: Sed Rate: 6 mm/h (ref 0–20)

## 2023-06-12 LAB — COMPREHENSIVE METABOLIC PANEL WITH GFR
ALT: 21 U/L (ref 0–35)
AST: 17 U/L (ref 0–37)
Albumin: 4.1 g/dL (ref 3.5–5.2)
Alkaline Phosphatase: 51 U/L (ref 39–117)
BUN: 9 mg/dL (ref 6–23)
CO2: 22 meq/L (ref 19–32)
Calcium: 9.1 mg/dL (ref 8.4–10.5)
Chloride: 106 meq/L (ref 96–112)
Creatinine, Ser: 0.63 mg/dL (ref 0.40–1.20)
GFR: 118.12 mL/min (ref >60.00)
Glucose, Bld: 100 mg/dL — ABNORMAL HIGH (ref 70–99)
Potassium: 3.6 meq/L (ref 3.5–5.1)
Sodium: 135 meq/L (ref 135–145)
Total Bilirubin: 0.4 mg/dL (ref 0.2–1.2)
Total Protein: 6.9 g/dL (ref 6.0–8.3)

## 2023-06-12 LAB — CBC
HCT: 41.8 % (ref 36.0–46.0)
Hemoglobin: 14 g/dL (ref 12.0–15.0)
MCHC: 33.5 g/dL (ref 30.0–36.0)
MCV: 87.5 fl (ref 78.0–100.0)
Platelets: 297 10*3/uL (ref 150.0–400.0)
RBC: 4.78 Mil/uL (ref 3.87–5.11)
RDW: 14.4 % (ref 11.5–15.5)
WBC: 11 10*3/uL — ABNORMAL HIGH (ref 4.0–10.5)

## 2023-06-12 LAB — C-REACTIVE PROTEIN: CRP: <1 mg/dL (ref 0.5–20.0)

## 2023-06-12 NOTE — Progress Notes (Signed)
 Established Patient Office Visit   Subjective:  Patient ID: Alyssa Olson, female    DOB: 07-Mar-1990  Age: 33 y.o. MRN: [id number]  Chief Complaint  Patient presents with   Edema    Pt complains of edema in ankle and feet x 5 days. Pt states closed shoes cause some tenderness and edema     HPI Encounter Diagnoses  Name Primary?   Swelling of body region Yes   Presents with a 4 to 5-day history of intermitted swelling in her legs ankles and hands.  Her lips have also been affected but rarely.  The swelling passes spontaneously.  There is no tightness in her throat or chest or difficulty breathing.  She has no seasonal or food allergies.  The swelling is usually worse in the morning.  She takes ibuprofen  for menstrual cramps and meloxicam  for carpal tunnel syndrome.  She is careful not to mix the 2.   Review of Systems  Constitutional: Negative.   HENT: Negative.    Eyes:  Negative for blurred vision, discharge and redness.  Respiratory: Negative.    Cardiovascular: Negative.   Gastrointestinal:  Negative for abdominal pain.  Genitourinary: Negative.   Musculoskeletal: Negative.  Negative for myalgias.  Skin:  Negative for rash.  Neurological:  Negative for tingling, loss of consciousness and weakness.  Endo/Heme/Allergies:  Negative for polydipsia.     Current Outpatient Medications:    fluocinonide  (LIDEX ) 0.05 % external solution, Apply 1 Application topically 2 (two) times daily., Disp: 60 mL, Rfl: 1   ibuprofen  (ADVIL ) 800 MG tablet, TAKE 1 TABLET(800 MG) BY MOUTH EVERY 8 HOURS AS NEEDED. DO NOT TAKE WITH MELOXICAM , Disp: 90 tablet, Rfl: 0   meloxicam  (MOBIC ) 15 MG tablet, Take 1 tablet (15 mg total) by mouth daily., Disp: 30 tablet, Rfl: 0   triamcinolone  cream (KENALOG ) 0.1 %, Apply 1 application topically 2 (two) times daily., Disp: 30 g, Rfl: 0   ketoconazole  (NIZORAL ) 2 % cream, Apply 1 Application topically daily. (Patient not taking: Reported on 07/12/2022),  Disp: , Rfl:    loperamide  (IMODIUM  A-D) 2 MG tablet, Take 1 tablet (2 mg total) by mouth 4 (four) times daily as needed for diarrhea or loose stools. (Patient not taking: Reported on 06/12/2023), Disp: 20 tablet, Rfl: 0   ondansetron  (ZOFRAN -ODT) 4 MG disintegrating tablet, Take 1 tablet (4 mg total) by mouth every 8 (eight) hours as needed for nausea or vomiting. (Patient not taking: Reported on 06/12/2023), Disp: 20 tablet, Rfl: 0   ZEPBOUND  5 MG/0.5ML Pen, ADMINISTER 5 MG UNDER THE SKIN 1 TIME A WEEK, Disp: 2 mL, Rfl: 1   Objective:     BP 124/82   Pulse 91   Temp 97.7 F (36.5 C) (Temporal)   Ht 5\' 1"  (1.549 m)   Wt 179 lb 6.4 oz (81.4 kg)   LMP 04/23/2023   SpO2 95%   BMI 33.90 kg/m    Physical Exam Constitutional:      General: She is not in acute distress.    Appearance: Normal appearance. She is not ill-appearing, toxic-appearing or diaphoretic.  HENT:     Head: Normocephalic and atraumatic.     Right Ear: External ear normal.     Left Ear: External ear normal.     Mouth/Throat:     Mouth: Mucous membranes are moist.     Pharynx: Oropharynx is clear. No oropharyngeal exudate or posterior oropharyngeal erythema.  Eyes:     General: No scleral icterus.  Right eye: No discharge.        Left eye: No discharge.     Extraocular Movements: Extraocular movements intact.     Conjunctiva/sclera: Conjunctivae normal.     Pupils: Pupils are equal, round, and reactive to light.  Cardiovascular:     Rate and Rhythm: Normal rate and regular rhythm.  Pulmonary:     Effort: Pulmonary effort is normal. No respiratory distress.     Breath sounds: Normal breath sounds. No wheezing, rhonchi or rales.  Musculoskeletal:        General: No swelling.     Right hand: No swelling.     Left hand: No swelling.     Cervical back: No rigidity or tenderness.     Right lower leg: No swelling. No edema.     Left lower leg: No swelling. No edema.  Skin:    General: Skin is warm and dry.   Neurological:     Mental Status: She is alert and oriented to person, place, and time.  Psychiatric:        Mood and Affect: Mood normal.        Behavior: Behavior normal.      No results found for any visits on 06/12/23.    The ASCVD Risk score (Arnett DK, et al., 2019) failed to calculate for the following reasons:   The 2019 ASCVD risk score is only valid for ages 31 to 35    Assessment & Plan:   Swelling of body region -     CBC -     Comprehensive metabolic panel with GFR -     C4 complement -     Sedimentation rate -     C-reactive protein -     hCG, serum, qualitative    Return Schedule follow-up with Lauren in a few weeks..  With her reported symptoms would have to consider angioedema.  Workup for that was ordered above.  Tonna Frederic, MD

## 2023-06-12 NOTE — Telephone Encounter (Signed)
  Chief Complaint: swelling to hands, feet, and occasionally her feet Symptoms: swelling Frequency: about a week Pertinent Negatives: Patient denies fever, redness,  Disposition: [] ED /[] Urgent Care (no appt availability in office) / [x] Appointment(In office/virtual)/ []  [facility]/ [] Home Care/ [] Refused Recommended Disposition /[] [facility]/ []  Follow-up with PCP Additional Notes: Pt states this is ongoing issue. Pt states that she feels it is related to her BP, though she has never been on antihypertensives. Pt scheduled today. Pt states that it affects her hands, face, and ankles/feet.  Reason for Disposition  Swelling of face, arm or hands  (Exception: Slight puffiness of fingers occurring during hot weather.)  Answer Assessment - Initial Assessment Questions 1. ONSET: "When did the swelling start?" (e.g., minutes, hours, days)     About a week 2. LOCATION: "What part of the leg is swollen?"  "Are both legs swollen or just one leg?"     Feet and ankles 3. SEVERITY: "How bad is the swelling?" (e.g., localized; mild, moderate, severe)   - Localized: Small area of swelling localized to one leg.   - MILD pedal edema: Swelling limited to foot and ankle, pitting edema < 1/4 inch (6 mm) deep, rest and elevation eliminate most or all swelling.   - MODERATE edema: Swelling of lower leg to knee, pitting edema > 1/4 inch (6 mm) deep, rest and elevation only partially reduce swelling.   - SEVERE edema: Swelling extends above knee, facial or hand swelling present.      moderate 4. REDNESS: "Does the swelling look red or infected?"     denies 5. PAIN: "Is the swelling painful to touch?" If Yes, ask: "How painful is it?"   (Scale 1-10; mild, moderate or severe)     Walking a lot or closed shoes causes mild pain 6. FEVER: "Do you have a fever?" If Yes, ask: "What is it, how was it measured, and when did it start?"      denies 7. CAUSE: "What do you think is causing the leg  swelling?"     BP 8. MEDICAL HISTORY: "Do you have a history of blood clots (e.g., DVT), cancer, heart failure, kidney disease, or liver failure?"     denies 9. RECURRENT SYMPTOM: "Have you had leg swelling before?" If Yes, ask: "When was the last time?" "What happened that time?"     Same s/s but never had it evaluted 10. OTHER SYMPTOMS: "Do you have any other symptoms?" (e.g., chest pain, difficulty breathing)       SOB-about a year 11. PREGNANCY: "Is there any chance you are pregnant?" "When was your last menstrual period?"       denies  Protocols used: Leg Swelling and Edema-A-AH

## 2023-06-12 NOTE — Telephone Encounter (Signed)
 Noted. Patient scheduled by Nurse Triage for an appointment to see Dr. Tilmon Font today.

## 2023-06-13 LAB — HCG, SERUM, QUALITATIVE: Preg, Serum: NEGATIVE

## 2023-06-13 LAB — C4 COMPLEMENT: C4 Complement: 17 mg/dL (ref 15–57)

## 2023-06-28 ENCOUNTER — Encounter: Admitting: Nurse Practitioner

## 2023-07-17 ENCOUNTER — Telehealth: Payer: Self-pay

## 2023-07-17 ENCOUNTER — Other Ambulatory Visit (HOSPITAL_COMMUNITY): Payer: Self-pay

## 2023-07-17 NOTE — Telephone Encounter (Signed)
 Pharmacy Patient Advocate Encounter   Received notification from CoverMyMeds that prior authorization for Zepbound  5MG /0.5ML pen-injectors  is required/requested.   Insurance verification completed.   The patient is insured through [facility] . (Key: [id number])   Per TEST CLAIM AT PHARMACY:  MUST USE OR LISTAT, QSYMIA, SAXENDA, WEGOVY OR MED NEC is preferred by the INSURANCE.  If suggested medication is appropriate, Please send in a new RX and discontinue this one. If not, please advise as to why it's not appropriate so that we may request a Prior Authorization. Please note, THAT THEY MAY BE preferred medications BUT MAY STILL REQUIRE A PA.  If the suggested medications have not been trialed and there are no contraindications to their use, the PA will not be submitted, as it will not be approved.   PLEASE BE ADVISED. I HAVE SENT TO INSURANCE TO PROCESS AS WELL IN CMM  Per PLAN CLAIM: PA required; PA started via CoverMyMeds. KEY [id number] . Waiting for clinical questions to populate.

## 2023-07-18 ENCOUNTER — Ambulatory Visit (INDEPENDENT_AMBULATORY_CARE_PROVIDER_SITE_OTHER): Admitting: Nurse Practitioner

## 2023-07-18 VITALS — BP 120/82 | HR 81 | Ht 61.0 in | Wt 179.2 lb

## 2023-07-18 DIAGNOSIS — G5603 Carpal tunnel syndrome, bilateral upper limbs: Secondary | ICD-10-CM | POA: Insufficient documentation

## 2023-07-18 DIAGNOSIS — M2560 Stiffness of unspecified joint, not elsewhere classified: Secondary | ICD-10-CM | POA: Diagnosis not present

## 2023-07-18 DIAGNOSIS — E782 Mixed hyperlipidemia: Secondary | ICD-10-CM

## 2023-07-18 DIAGNOSIS — R7301 Impaired fasting glucose: Secondary | ICD-10-CM | POA: Diagnosis not present

## 2023-07-18 DIAGNOSIS — Z0184 Encounter for antibody response examination: Secondary | ICD-10-CM

## 2023-07-18 DIAGNOSIS — M2540 Effusion, unspecified joint: Secondary | ICD-10-CM | POA: Diagnosis not present

## 2023-07-18 NOTE — Assessment & Plan Note (Signed)
 She experiences intermittent swelling in her hands and ankles with stiffness but no pain. Check ANA, RF, TSH, food allergy panel, and celiac panel. Advise dietary monitoring and tracking for triggers. Encourage reduced dietary sodium intake. Follow-up in 4 weeks.

## 2023-07-18 NOTE — Assessment & Plan Note (Signed)
 Chronic, ongoing. Check lipid panel today (not fasting).

## 2023-07-18 NOTE — Assessment & Plan Note (Signed)
 Joint stiffness associated with swelling. No fevers, rash. Will check ANA, RF, celiac panel. Follow-up in 4 weeks.

## 2023-07-18 NOTE — Assessment & Plan Note (Signed)
 She has been taking meloxicam  and wearing a brace at night which has not helped. She is having weakness in both wrists and having a hard time brushing her hair and opening jars. Referral placed to orthopedics.

## 2023-07-18 NOTE — Telephone Encounter (Signed)
 PLEASE BE ADVISED Clinical questions have been answered and PA submitted.TO PLAN. PA currently Pending.

## 2023-07-18 NOTE — Telephone Encounter (Signed)
 Patient seen in office and not on the Zepbound  medication

## 2023-07-18 NOTE — Patient Instructions (Signed)
 It was great to see you!  We are checking your labs today and will let you know the results via mychart/phone.   I have placed a referral to orthopedics they will call to schedule   Let's follow-up in 4 weeks, sooner if you have concerns.  If a referral was placed today, you will be contacted for an appointment. Please note that routine referrals can sometimes take up to 3-4 weeks to process. Please call our office if you haven't heard anything after this time frame.  Take care,  Tinnie Harada, NP

## 2023-07-18 NOTE — Progress Notes (Signed)
 Established Patient Office Visit  Subjective   Patient ID: Alyssa Olson, female    DOB: February 16, 1990  Age: 33 y.o. MRN: [id number]  Chief Complaint  Patient presents with   Follow-up    Want to discuss labs done back in may   Edema    In hands and ankles; want to discuss carpel tunnel; been going on for a while; hard to do daily activities or function normally    HPI Discussed the use of AI scribe software for clinical note transcription with the patient, who gave verbal consent to proceed.  History of Present Illness   Alyssa Olson is a 33 year old female who presents with swelling and stiffness in her hands and ankles.  She experiences swelling in her hands and ankles approximately every other day, causing discomfort and stiffness, particularly in her fingers and ankles. Weakness in her hands affects daily tasks such as doing her hair or holding a fork, and she has difficulty with grip strength. Meloxicam  has not provided significant relief, and she uses a brace at night. She has not changed what she is eating and typically does not eat salty foods. She denies any tick bites, fever, and rash.   She has a history of sleep apnea but has not been using a CPAP machine due to cost. She previously experienced lip swelling and puffiness around her eyes upon waking, which resolved spontaneously.       ROS See pertinent positives and negatives per HPI.    Objective:     BP 120/82   Pulse 81   Ht 5' 1 (1.549 m)   Wt 179 lb 3.2 oz (81.3 kg)   SpO2 98%   BMI 33.86 kg/m    Physical Exam Vitals and nursing note reviewed.  Constitutional:      General: She is not in acute distress.    Appearance: Normal appearance.  HENT:     Head: Normocephalic.  Eyes:     Conjunctiva/sclera: Conjunctivae normal.  Cardiovascular:     Rate and Rhythm: Normal rate and regular rhythm.     Pulses: Normal pulses.     Heart sounds: Normal heart sounds.  Pulmonary:     Effort:  Pulmonary effort is normal.     Breath sounds: Normal breath sounds.  Musculoskeletal:        General: No swelling or tenderness. Normal range of motion.     Cervical back: Normal range of motion.     Comments: Phalen's test positive on right  Skin:    General: Skin is warm.  Neurological:     General: No focal deficit present.     Mental Status: She is alert and oriented to person, place, and time.  Psychiatric:        Mood and Affect: Mood normal.        Behavior: Behavior normal.        Thought Content: Thought content normal.        Judgment: Judgment normal.      Assessment & Plan:   Problem List Items Addressed This Visit       Nervous and Auditory   Bilateral carpal tunnel syndrome   She has been taking meloxicam  and wearing a brace at night which has not helped. She is having weakness in both wrists and having a hard time brushing her hair and opening jars. Referral placed to orthopedics.       Relevant Orders   Ambulatory referral to Orthopedic Surgery  Musculoskeletal and Integument   Swelling of multiple joints   She experiences intermittent swelling in her hands and ankles with stiffness but no pain. Check ANA, RF, TSH, food allergy panel, and celiac panel. Advise dietary monitoring and tracking for triggers. Encourage reduced dietary sodium intake. Follow-up in 4 weeks.       Relevant Orders   ANA w/Reflex   Rheumatoid Factor   TSH   Celiac Disease Panel   Food Allergy Profile     Other   Joint stiffness - Primary   Joint stiffness associated with swelling. No fevers, rash. Will check ANA, RF, celiac panel. Follow-up in 4 weeks.       Relevant Orders   ANA w/Reflex   Rheumatoid Factor   TSH   Celiac Disease Panel   Food Allergy Profile   Mixed hyperlipidemia   Chronic, ongoing. Check lipid panel today (not fasting).       Relevant Orders   Lipid panel   Other Visit Diagnoses       IFG (impaired fasting glucose)       Screen A1c today    Relevant Orders   Hemoglobin A1c     Immunity status testing       Check hepatitis B immunity today.   Relevant Orders   Hepatitis B surface antibody,quantitative       Return in about 4 weeks (around 08/15/2023) for CPE.    Tinnie DELENA Harada, NP

## 2023-07-19 ENCOUNTER — Ambulatory Visit: Payer: Self-pay | Admitting: Nurse Practitioner

## 2023-07-19 LAB — CELIAC DISEASE PANEL
(tTG) Ab, IgA: <1 U/mL
(tTG) Ab, IgG: <1 U/mL
Gliadin IgA: 2.6 U/mL
Gliadin IgG: 1.8 U/mL
Immunoglobulin A: 256 mg/dL (ref 47–310)

## 2023-07-19 LAB — FOOD ALLERGY PROFILE

## 2023-07-19 LAB — LIPID PANEL
Cholesterol: 219 mg/dL — ABNORMAL HIGH (ref 0–200)
HDL: 36.6 mg/dL — ABNORMAL LOW (ref >39.00)
LDL Cholesterol: 111 mg/dL — ABNORMAL HIGH (ref 0–99)
NonHDL: 182.12
Total CHOL/HDL Ratio: 6
Triglycerides: 355 mg/dL — ABNORMAL HIGH (ref 0.0–149.0)
VLDL: 71 mg/dL — ABNORMAL HIGH (ref 0.0–40.0)

## 2023-07-19 LAB — ANA W/REFLEX: Anti Nuclear Antibody (ANA): NEGATIVE

## 2023-07-19 LAB — HEMOGLOBIN A1C: Hgb A1c MFr Bld: 5.5 % (ref 4.6–6.5)

## 2023-07-19 LAB — RHEUMATOID FACTOR: Rheumatoid fact SerPl-aCnc: <10 [IU]/mL (ref <14)

## 2023-07-19 LAB — HEPATITIS B SURFACE ANTIBODY, QUANTITATIVE: Hep B S AB Quant (Post): <5 m[IU]/mL — ABNORMAL LOW (ref >=10)

## 2023-07-19 LAB — TSH: TSH: 1.46 u[IU]/mL (ref 0.35–5.50)

## 2023-07-19 LAB — INTERPRETATION:

## 2023-08-06 ENCOUNTER — Other Ambulatory Visit: Payer: Self-pay | Admitting: Family Medicine

## 2023-08-06 DIAGNOSIS — N898 Other specified noninflammatory disorders of vagina: Secondary | ICD-10-CM

## 2023-08-07 ENCOUNTER — Ambulatory Visit (INDEPENDENT_AMBULATORY_CARE_PROVIDER_SITE_OTHER): Admitting: Orthopaedic Surgery

## 2023-08-07 DIAGNOSIS — G5601 Carpal tunnel syndrome, right upper limb: Secondary | ICD-10-CM

## 2023-08-07 DIAGNOSIS — G5602 Carpal tunnel syndrome, left upper limb: Secondary | ICD-10-CM | POA: Diagnosis not present

## 2023-08-07 NOTE — Progress Notes (Signed)
 Office Visit Note   Patient: Alyssa Olson           Date of Birth: 1990/03/09           MRN: [id number] Visit Date: 08/07/2023              Requested by: Nedra Tinnie LABOR, NP [address] [place],  [place] [address] PCP: Nedra Tinnie LABOR, NP   Assessment & Plan: Visit Diagnoses:  1. Right carpal tunnel syndrome   2. Left carpal tunnel syndrome     Plan: History of Present Illness Alyssa Olson is a 33 year old female who presents with numbness in her pinky and ring fingers of both hands. She was referred by her primary care doctor for evaluation of numbness in her pinky and ring fingers.  She experiences numbness in her pinky and ring fingers on both hands, particularly upon waking, with a sensation of swelling. Her thumb, index, and middle fingers are unaffected. Prolonged computer use exacerbates her symptoms, leading to cramping sensations. She wears a brace that provides minimal relief. There is no elbow pain or symptoms when her elbow is tapped.  She experiences episodes where her hands 'lock up' when holding objects like a fork or doing her hair, primarily affecting her pinky and ring fingers. Tenderness is noted in her hands, especially when typing or performing tasks that require gripping, such as opening a can. There is no snapping sensation in her fingers.  Physical Exam MUSCULOSKELETAL: Numbness in ring and small fingers on carpal tunnel compression.  Negative tinel at cubital tunnel bilateral elbows.  Ulnar nerve stable.  Elbow ROM normal.  No symptoms in median nerve distribution.    Assessment and Plan Bilateral Cubital Tunnel Syndrome Numbness in pinky and ring fingers due to ulnar nerve compression.  Symptoms still mild and has only present in the last month. - Advise positional changes, such as sleeping with elbows straight. - Consider anti-inflammatory treatment with steroid dose pack. - Consider nerve conduction study if symptoms worsen  or persist.  Muscle Fatigue Intermittent finger locking due to muscle fatigue from prolonged use of interosseous muscles. - Advise breaks during activities involving prolonged hand muscle use. - Encourage hydration and electrolyte intake.  Follow-Up Instructions: No follow-ups on file.   Orders:  No orders of the defined types were placed in this encounter.  No orders of the defined types were placed in this encounter.     Procedures: No procedures performed   Clinical Data: No additional findings.   Subjective: Chief Complaint  Patient presents with   Left Hand - Numbness   Right Hand - Numbness    HPI  Review of Systems  Constitutional: Negative.   HENT: Negative.    Eyes: Negative.   Respiratory: Negative.    Cardiovascular: Negative.   Endocrine: Negative.   Musculoskeletal: Negative.   Neurological: Negative.   Hematological: Negative.   Psychiatric/Behavioral: Negative.    All other systems reviewed and are negative.    Objective: Vital Signs: There were no vitals taken for this visit.  Physical Exam Vitals and nursing note reviewed.  Constitutional:      Appearance: She is well-developed.  HENT:     Head: Atraumatic.     Nose: Nose normal.  Eyes:     Extraocular Movements: Extraocular movements intact.  Cardiovascular:     Pulses: Normal pulses.  Pulmonary:     Effort: Pulmonary effort is normal.  Abdominal:     Palpations: Abdomen  is soft.  Musculoskeletal:     Cervical back: Neck supple.  Skin:    General: Skin is warm.     Capillary Refill: Capillary refill takes less than 2 seconds.  Neurological:     Mental Status: She is alert. Mental status is at baseline.  Psychiatric:        Behavior: Behavior normal.        Thought Content: Thought content normal.        Judgment: Judgment normal.     Ortho Exam  Specialty Comments:  No specialty comments available.  Imaging: No results found.   PMFS History: Patient Active  Problem List   Diagnosis Date Noted   Joint stiffness 07/18/2023   Bilateral carpal tunnel syndrome 07/18/2023   Swelling of multiple joints 07/18/2023   Mixed hyperlipidemia 07/18/2023   OSA (obstructive sleep apnea) 10/17/2022   Seborrheic dermatitis 09/01/2022   Recurrent vaginitis 09/28/2021   Infertility counseling 09/28/2021   Wrist tendonitis 07/21/2021   Menorrhagia with regular cycle 07/21/2021   Elevated blood pressure reading 07/21/2021   Acute bilateral ankle pain 07/21/2021   Anxiety and depression 07/21/2021   Ovarian cyst 06/17/2018   Hypertriglyceridemia 05/21/2015   Obesity (BMI 30-39.9) 11/09/2014   Past Medical History:  Diagnosis Date   Acute appendicitis    Anxiety    Bacterial vaginosis    History of chlamydia    Ovarian cyst     Family History  Problem Relation Age of Onset   Healthy Mother    Healthy Father    Vision loss Maternal Grandmother    Cancer Maternal Grandfather        colon   Hypertension Maternal Grandfather    Arthritis Maternal Grandfather    Diabetes Maternal Grandfather     Past Surgical History:  Procedure Laterality Date   APPENDECTOMY     LAPAROSCOPIC APPENDECTOMY N/A 08/05/2016   Procedure: APPENDECTOMY LAPAROSCOPIC;  Surgeon: Desiderio Schanz, MD;  Location: [facility] ORS;  Service: General;  Laterality: N/A;   Social History   Occupational History    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

## 2023-08-27 ENCOUNTER — Telehealth: Admitting: Physician Assistant

## 2023-08-27 DIAGNOSIS — N760 Acute vaginitis: Secondary | ICD-10-CM

## 2023-08-27 MED ORDER — METRONIDAZOLE 500 MG PO TABS: 500.0000 mg | ORAL_TABLET | Freq: Two times a day (BID) | ORAL | 0 refills | Status: AC

## 2023-08-27 NOTE — Progress Notes (Signed)

## 2023-11-19 ENCOUNTER — Encounter: Payer: Self-pay | Admitting: Radiology

## 2024-02-07 ENCOUNTER — Ambulatory Visit: Admitting: Nurse Practitioner
